# Patient Record
Sex: Female | Born: 1980 | Race: White | Hispanic: No | Marital: Married | Smoking: Never smoker
Health system: Southern US, Community
[De-identification: ages and names within clinical notes are randomized; demographics above are authoritative.]

## PROBLEM LIST (undated history)

## (undated) DIAGNOSIS — N83209 Unspecified ovarian cyst, unspecified side: Secondary | ICD-10-CM

## (undated) DIAGNOSIS — J45909 Unspecified asthma, uncomplicated: Secondary | ICD-10-CM

## (undated) HISTORY — DX: Unspecified ovarian cyst, unspecified side: N83.209

## (undated) HISTORY — DX: Unspecified asthma, uncomplicated: J45.909

## (undated) HISTORY — PX: CHOLECYSTECTOMY: SHX55

## (undated) HISTORY — PX: MOLE REMOVAL: SHX2046

## (undated) HISTORY — PX: KNEE SURGERY: SHX244

---

## 2007-01-18 ENCOUNTER — Ambulatory Visit: Payer: Self-pay | Admitting: Family Medicine

## 2007-02-23 ENCOUNTER — Ambulatory Visit: Payer: Self-pay | Admitting: Family Medicine

## 2007-02-23 DIAGNOSIS — S335XXA Sprain of ligaments of lumbar spine, initial encounter: Secondary | ICD-10-CM | POA: Insufficient documentation

## 2007-02-24 ENCOUNTER — Encounter: Payer: Self-pay | Admitting: Family Medicine

## 2007-04-15 ENCOUNTER — Ambulatory Visit: Payer: Self-pay | Admitting: Family Medicine

## 2007-04-15 DIAGNOSIS — A0839 Other viral enteritis: Secondary | ICD-10-CM | POA: Insufficient documentation

## 2007-11-28 ENCOUNTER — Ambulatory Visit: Payer: Self-pay | Admitting: Family Medicine

## 2007-12-26 ENCOUNTER — Other Ambulatory Visit: Admission: RE | Admit: 2007-12-26 | Discharge: 2007-12-26 | Payer: Self-pay | Admitting: Obstetrics and Gynecology

## 2008-03-15 ENCOUNTER — Ambulatory Visit: Payer: Self-pay | Admitting: Family Medicine

## 2008-03-15 DIAGNOSIS — J9801 Acute bronchospasm: Secondary | ICD-10-CM

## 2008-03-22 ENCOUNTER — Ambulatory Visit: Payer: Self-pay | Admitting: Family Medicine

## 2008-03-22 DIAGNOSIS — H6690 Otitis media, unspecified, unspecified ear: Secondary | ICD-10-CM | POA: Insufficient documentation

## 2008-04-05 ENCOUNTER — Ambulatory Visit: Payer: Self-pay | Admitting: Family Medicine

## 2008-09-05 ENCOUNTER — Ambulatory Visit: Payer: Self-pay | Admitting: Family Medicine

## 2008-09-05 DIAGNOSIS — J069 Acute upper respiratory infection, unspecified: Secondary | ICD-10-CM | POA: Insufficient documentation

## 2010-09-12 ENCOUNTER — Ambulatory Visit
Admission: RE | Admit: 2010-09-12 | Discharge: 2010-09-12 | Payer: Self-pay | Source: Home / Self Care | Attending: Family Medicine | Admitting: Family Medicine

## 2010-09-12 DIAGNOSIS — J209 Acute bronchitis, unspecified: Secondary | ICD-10-CM | POA: Insufficient documentation

## 2010-09-30 ENCOUNTER — Ambulatory Visit: Admit: 2010-09-30 | Payer: Self-pay | Admitting: Family Medicine

## 2010-10-09 NOTE — Assessment & Plan Note (Signed)
Summary: cough   Vital Signs:  Patient profile:   30 year old female Height:      63 inches Weight:      144 pounds BMI:     25.60 O2 Sat:      98 % on Room air Temp:     98.1 degrees F oral Pulse rate:   80 / minute BP sitting:   121 / 73  (left arm) Cuff size:   regular  Vitals Entered By: Payton Spark CMA (September 12, 2010 2:28 PM)  O2 Flow:  Room air CC: Bad cough x 3 days- getting worse.   Primary Care Provider:  Nani Gasser MD  CC:  Bad cough x 3 days- getting worse.Marland Kitchen  History of Present Illness: 30 yo WF presents for a worsening cough x 3 days to the point of post tussive emesis.  She has coughed up green phlegm.  She has slight nauseated.  She is congested now.  No fevers or chills.  Geting more HAs.  The cough is keeping her up at night. Nyquil and Robutissin helps some.  She has chest tightness and SOB.  She was told that she had 'chronic bronchitis' in the past but has not been on anything.  30 She has never been diagnosed with allergies and she is not a smoker.    She has mild sore throat but no rhinorrhea.  Current Medications (verified): 1)  None  Allergies (verified): No Known Drug Allergies  Past History:  Past Medical History: Reviewed history from 04/05/2008 and no changes required. Spirometry 04-05-08 FVC 93%, FEV1 82%, FEV1% 87% with no response to albuterl. Normal spiro  Social History: Reviewed history from 01/18/2007 and no changes required. 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 Single but has a roomate.   Never Smoked Alcohol use-yes Drug use-no Regular exercise-yes  Review of Systems      See HPI  Physical Exam  General:  alert, well-developed, well-nourished, and well-hydrated.   Head:  normocephalic and atraumatic.  sinuses NTTP Eyes:  conjunctiva clear Ears:  EACs patent; TMs translucent and gray with good cone of light and bony landmarks.  Nose:  nasal congestion present, no  rhinorrhea Mouth:  o/p slightly injected iwth clear postnasal drip Neck:  no masses.   Chest Wall:  no tenderness.   Lungs:  Normal respiratory effort, chest expands symmetrically. Lungs are clear to auscultation, no crackles or wheezes.  dry cough with scant rhonchi Heart:  Normal rate and regular rhythm. S1 and S2 normal without gallop, murmur, click, rub or other extra sounds. Skin:  color normal.   Cervical Nodes:  shotty anterior cervical chain LA   Impression & Recommendations:  Problem # 1:  ACUTE BRONCHITIS (ICD-466.0)  Chronic cough (probable allergies or allergies) with worsening of symptoms along with PTE. Will treat with Amoxicillin x 10 days, Delsym during the day and Nyquil at night.   Added ProAir 2 puffs 3-4 x a day for 10 days for SOB/ chest tightness.  PFs in yellow zone. Recheck PFs in 2 wks.  Had normal PFTs in 09. Consider repeat or allergy testing. The following medications were removed from the medication list:    Ventolin Hfa 108 (90 Base) Mcg/act Aers (Albuterol sulfate) .Marland Kitchen... 2 puffs q 6 hrs as needed Her updated medication list for this problem includes:    Amoxicillin 500 Mg Caps (Amoxicillin) .Marland Kitchen... 1 capsule by mouth three times a day x 10 days  Proair Hfa 108 (90 Base) Mcg/act Aers (Albuterol sulfate) .Marland Kitchen... 2 puffs 4 x a day as needed for dry cough and shortness of breathe  Orders: Peak Flow Rate (94150)  Complete Medication List: 1)  Amoxicillin 500 Mg Caps (Amoxicillin) .Marland Kitchen.. 1 capsule by mouth three times a day x 10 days 2)  Proair Hfa 108 (90 Base) Mcg/act Aers (Albuterol sulfate) .... 2 puffs 4 x a day as needed for dry cough and shortness of breathe  Patient Instructions: 1)  Treat with Amoxicillin 3 x a day x 10 days for bronchitits. 2)  Use ProAir inhaler 3-4 x a day for the next 7-10 days. 3)  Use Delsym during the day and Nyquil at night for cough. 4)  Return for f/u cough/ repeat peak flows in 2 wks. Prescriptions: AMOXICILLIN 500 MG CAPS  (AMOXICILLIN) 1 capsule by mouth three times a day x 10 days  #30 x 0   Entered and Authorized by:   Seymour Bars DO   Signed by:   Seymour Bars DO on 09/12/2010   Method used:   Electronically to        [facility] [phone]* (retail)       [address] [place], [place]  [address]       Ph: [phone] or [phone]       Fax: [phone]   RxID:   [id number]    Orders Added: 1)  Est. Patient Level III [[address]644] 2)  Peak Flow Rate [94150]

## 2011-08-04 ENCOUNTER — Telehealth: Payer: Self-pay | Admitting: Family Medicine

## 2011-08-04 MED ORDER — AZITHROMYCIN 250 MG PO TABS: ORAL_TABLET | ORAL | Status: AC

## 2011-08-04 NOTE — Telephone Encounter (Signed)
Will send rx to [facility]

## 2011-08-04 NOTE — Telephone Encounter (Signed)
Call pt: exposed to pertusis at work. Health dept rec prophylactic tx. Will send over zpack for tx. I entered med, please get her pharmacy to fax to.

## 2012-06-02 ENCOUNTER — Encounter: Payer: Self-pay | Admitting: Family Medicine

## 2012-06-02 ENCOUNTER — Ambulatory Visit (INDEPENDENT_AMBULATORY_CARE_PROVIDER_SITE_OTHER): Payer: Self-pay | Admitting: Family Medicine

## 2012-06-02 VITALS — BP 111/76 | HR 90 | Wt 156.0 lb

## 2012-06-02 DIAGNOSIS — B354 Tinea corporis: Secondary | ICD-10-CM

## 2012-06-02 MED ORDER — PREDNISONE 20 MG PO TABS: ORAL_TABLET | ORAL | Status: AC

## 2012-06-02 MED ORDER — CLOTRIMAZOLE 1 % EX CREA
TOPICAL_CREAM | CUTANEOUS | Status: AC
Start: 2012-06-02 — End: 2012-07-02

## 2012-06-02 NOTE — Progress Notes (Signed)
CC: Stephanie Hogan is a 31 y.o. female is here for Rash and mole on back   Subjective: HPI:  Complains of a mole on the right side of her back and present for years has not been changing in size or appearance more she can tell but it is hard to view given its location. She asks if symptoms of her worry about. Has not caused any psychological or physical discomfort nor bleeding. She's no personal history of skin cancers but she does spend time in the sun without sun block.  Her primary complaint is a rash that's been present for matter of days and getting worse in a daily basis located on the right thigh right lateral midback and left lateral midback in addition to a new regimen this morning on the left medial collarbone region. Is described as a itchy and stinging rash she's never had before. She's not been exposed to anybody with similar rashes. 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 She denies rapid heartbeat, flushing, GI disturbance, sore in the mouth or tongue, wheezing, nor shortness of breath. No interventions as of yet   Review Of Systems Outlined In HPI  No past medical history on file.   No family history on file.   History  Substance Use Topics  . Smoking status: Never Smoker   . Smokeless tobacco: Not on file  . Alcohol Use: No     Objective: Filed Vitals:   06/02/12 0940  BP: 111/76  Pulse: 90    General: Alert and Oriented, No Acute Distress HEENT: Pupils equal, round, reactive to light. Conjunctivae clear.    Moist mucous membranes, pharynx without inflammation nor lesions.  Neck supple without palpable lymphadenopathy nor abnormal masses. Lungs: Clear to auscultation bilaterally, no wheezing/ronchi/rales.  Comfortable work of breathing. Good air movement. Cardiac: Regular rate and rhythm.  Skin: Warm and dry. Near the inferior portion of the right shoulder blade there  is a raised pigmented lesion that is symmetric, without border irregular these, homogenous color, a diameter less than eraser head. There is a separate skin eruption that appears scaly, with a macular papular appearance with mild erythema and with central clearing is involving the left axilla, right lateral thigh, left inferior angle of the scapula.  Skin scraping for KOH prep was prepared from the above scaling lesions revealing hyphae.  Assessment & Plan: [name] was seen today for rash and mole on back.  Diagnoses and associated orders for this visit:  Tinea corporis - predniSONE (DELTASONE) 20 MG tablet; Three tabs at once daily for five days. - clotrimazole (LOTRIMIN) 1 % cream; Apply to affected areas twice a day for up to four weeks, applying up to two weeks after resolution of symptoms.    Discussed with the patient that her pigmented lesion did not meet criteria for to be suspicious of a melanoma however I gave her the option of excision to get a definitive diagnosis she declined. She states the itching is "unbearable" therefore we'll start prednisone she has taken this before and understands the expected side effects, described her that this may make the rash worse she hasn't used definitive treatment being clotrimazole twice a day basis. Asked her to return next week if not improving. Discussed the need to cover this while in the daycare setting or wall exposed to others to prevent transmission.  Return if symptoms worsen or fail to improve.

## 2014-01-17 DIAGNOSIS — R748 Abnormal levels of other serum enzymes: Secondary | ICD-10-CM | POA: Insufficient documentation

## 2014-01-30 ENCOUNTER — Ambulatory Visit (INDEPENDENT_AMBULATORY_CARE_PROVIDER_SITE_OTHER): Payer: Self-pay | Admitting: Obstetrics & Gynecology

## 2014-01-30 ENCOUNTER — Encounter: Payer: Self-pay | Admitting: Obstetrics & Gynecology

## 2014-01-30 VITALS — BP 114/80 | HR 100 | Resp 16 | Ht 62.0 in | Wt 143.0 lb

## 2014-01-30 DIAGNOSIS — N83209 Unspecified ovarian cyst, unspecified side: Secondary | ICD-10-CM

## 2014-01-30 MED ORDER — NORETHIN-ETH ESTRAD-FE BIPHAS 1 MG-10 MCG / 10 MCG PO THPK: 1.0000 | PACK | Freq: Every morning | ORAL | Status: DC

## 2014-01-30 NOTE — Progress Notes (Signed)
   Subjective:    Patient ID: Stephanie Hogan, female    DOB: March 24, 1981, 33 y.o.   MRN: [id number]  HPI  33 yo SW G0 here today to discuss an ovarian cyst that was discovered with a CT in [place] that was done for her gallstones about 3 weeks ago. She had her GB removed 1 week ago.   She was last sexually active about 3-4 weeks ago (held off due to the GB pain). She uses condoms for contraception. She does report heavy periods with clots that last 4-6 days. Sometimes her periods are painful. She has never used OCPs.  Review of Systems     Objective:   Physical Exam        Assessment & Plan:  Ovarian cyst- I discussed normal ovulation and cysts. She now feels much better. She will start OCPs today to help with her menstrual cyst.

## 2014-04-18 ENCOUNTER — Encounter (HOSPITAL_COMMUNITY): Payer: Self-pay

## 2014-05-01 ENCOUNTER — Telehealth: Payer: Self-pay | Admitting: *Deleted

## 2014-05-03 NOTE — Telephone Encounter (Signed)
Error

## 2014-05-15 ENCOUNTER — Encounter: Payer: Self-pay | Admitting: Family Medicine

## 2014-05-15 ENCOUNTER — Ambulatory Visit (INDEPENDENT_AMBULATORY_CARE_PROVIDER_SITE_OTHER): Payer: Self-pay | Admitting: Family Medicine

## 2014-05-15 VITALS — BP 119/79 | HR 82 | Ht 62.0 in | Wt 144.0 lb

## 2014-05-15 DIAGNOSIS — Z0000 Encounter for general adult medical examination without abnormal findings: Secondary | ICD-10-CM

## 2014-05-15 DIAGNOSIS — Z23 Encounter for immunization: Secondary | ICD-10-CM

## 2014-05-15 NOTE — Patient Instructions (Addendum)
Psyllium Powder 1-2 heaping teaspoons daily to help regulate bowel movements.   Dr. Lajoyce Lauber General Advice Following Your Complete Physical Exam  The Benefits of Regular Exercise: Unless you suffer from an uncontrolled cardiovascular condition, studies strongly suggest that regular exercise and physical activity will add to both the quality and length of your life.  The World Health Organization recommends 150 minutes of moderate intensity aerobic activity every week.  This is best split over 3-4 days a week, and can be as simple as a brisk walk for just over 35 minutes "most days of the week".  This type of exercise has been shown to lower LDL-Cholesterol, lower average blood sugars, lower blood pressure, lower cardiovascular disease risk, improve memory, and increase one's overall sense of wellbeing.  The addition of anaerobic (or "strength training") exercises offers additional benefits including but not limited to increased metabolism, prevention of osteoporosis, and improved overall cholesterol levels.  How Can I Strive For A Low-Fat Diet?: Current guidelines recommend that 25-35 percent of your daily energy (food) intake should come from fats.  One might ask how can this be achieved without having to dissect each meal on a daily basis?  Switch to skim or 1% milk instead of whole milk.  Focus on lean meats such as ground [place], fresh fish, baked chicken, and lean cuts of beef as your source of dietary protein.  Limit saturated fat consumption to less than 10% of your daily caloric intake.  Limit trans fatty acid consumption primarily by limiting synthetic trans fats such as partially hydrogenated oils (Ex: fried fast foods).  Substitute olive or vegetable oil for solid fats where possible.  Moderation of Salt Intake: Provided you don't carry a diagnosis of congestive heart failure nor renal failure, I recommend a daily allowance of no more than 2300 mg of salt (sodium).  Keeping under this  daily goal is associated with a decreased risk of cardiovascular events, creeping above it can lead to elevated blood pressures and increases your risk of cardiovascular events.  Milligrams (mg) of salt is listed on all nutrition labels, and your daily intake can add up faster than you think.  Most canned and frozen dinners can pack in over half your daily salt allowance in one meal.    Lifestyle Health Risks: Certain lifestyle choices carry specific health risks.  As you may already know, tobacco use has been associated with increasing one's risk of cardiovascular disease, pulmonary disease, numerous cancers, among many other issues.  What you may not know is that there are medications and nicotine replacement strategies that can more than double your chances of successfully quitting.  I would be thrilled to help manage your quitting strategy if you currently use tobacco products.  When it comes to alcohol use, I've yet to find an "ideal" daily allowance.  Provided an individual does not have a medical condition that is exacerbated by alcohol consumption, general guidelines determine "safe drinking" as no more than two standard drinks for a man or no more than one standard drink for a female per day.  However, much debate still exists on whether any amount of alcohol consumption is technically "safe".  My general advice, keep alcohol consumption to a minimum for general health promotion.  If you or others believe that alcohol, tobacco, or recreational drug use is interfering with your life, I would be happy to provide confidential counseling regarding treatment options.  General "Over The Counter" Nutrition Advice: Postmenopausal women should aim for a daily calcium  intake of 1200 mg, however a significant portion of this might already be provided by diets including milk, yogurt, cheese, and other dairy products.  Vitamin D has been shown to help preserve bone density, prevent fatigue, and has even been shown  to help reduce falls in the elderly.  Ensuring a daily intake of 800 Units of Vitamin D is a good place to start to enjoy the above benefits, we can easily check your Vitamin D level to see if you'd potentially benefit from supplementation beyond 800 Units a day.  Folic Acid intake should be of particular concern to women of childbearing age.  Daily consumption of 505-397 mcg of Folic Acid is recommended to minimize the chance of spinal cord defects in a fetus should pregnancy occur.    For many adults, accidents still remain one of the most common culprits when it comes to cause of death.  Some of the simplest but most effective preventitive habits you can adopt include regular seatbelt use, proper helmet use, securing firearms, and regularly testing your smoke and carbon monoxide detectors.  Stephanie Hogan Stephanie Hogan [address] [place], [place], [place] [address] Phone: [phone]

## 2014-05-15 NOTE — Progress Notes (Signed)
CC: Stephanie Hogan is a 33 y.o. female is here for Annual Exam   Subjective: HPI:  Colonoscopy: No family history of colon cancer, will begin screening at age 63 Papsmear: Normal in June, repeat in 3-5 years Mammogram: No family history of breast cancer we'll consider screening at age 37  Influenza Vaccine: Will receive today Pneumovax: No current indication Td/Tdap: overdue, will receive today Zoster: (Start 34 yo)  Rare alcohol use no tobacco or recreational drug use   Review of Systems - General ROS: negative for - chills, fever, night sweats, weight gain or weight loss Ophthalmic ROS: negative for - decreased vision Psychological ROS: negative for - anxiety or depression ENT ROS: negative for - hearing change, nasal congestion, tinnitus or allergies Hematological and Lymphatic ROS: negative for - bleeding problems, bruising or swollen lymph nodes Breast ROS: negative Respiratory ROS: no cough, shortness of breath, or wheezing Cardiovascular ROS: no chest pain or dyspnea on exertion Gastrointestinal ROS: no abdominal pain, change in bowel habits, or black or bloody stools Genito-Urinary ROS: negative for - genital discharge, genital ulcers, incontinence or abnormal bleeding from genitals Musculoskeletal ROS: negative for - joint pain or muscle pain Neurological ROS: negative for - headaches or memory loss Dermatological ROS: negative for lumps, mole changes, rash and skin lesion changes  Past Medical History  Diagnosis Date  . Ovarian cyst   . Asthma     Past Surgical History  Procedure Laterality Date  . Cholecystectomy    . Knee surgery    . Mole removal     Family History  Problem Relation Age of Onset  . Lung cancer Maternal Grandfather   . Breast cancer Maternal Grandmother   . Alzheimer's disease Paternal Grandfather     History   Social History  . Marital Status: Single    Spouse Name: N/A    Number of Children: N/A  . Years of Education: N/A    Occupational History  . Not on file.   Social History Main Topics  . Smoking status: Never Smoker   . Smokeless tobacco: Never Used  . Alcohol Use: 1.0 oz/week    2 drink(s) per week     Comment: Occasional  . Drug Use: No  . Sexual Activity: Yes   Other Topics Concern  . Not on file   Social History Narrative  . No narrative on file     Objective: BP 119/79  Pulse 82  Ht 5\' 2"  (1.575 m)  Wt 144 lb (65.318 kg)  BMI 26.33 kg/m2  General: No Acute Distress HEENT: Atraumatic, normocephalic, conjunctivae normal without scleral icterus.  No nasal discharge, hearing grossly intact, TMs with good landmarks bilaterally with no middle ear abnormalities, posterior pharynx clear without oral lesions. Neck: Supple, trachea midline, no cervical nor supraclavicular adenopathy. Pulmonary: Clear to auscultation bilaterally without wheezing, rhonchi, nor rales. Cardiac: Regular rate and rhythm.  No murmurs, rubs, nor gallops. No peripheral edema.  2+ peripheral pulses bilaterally. Abdomen: Bowel sounds normal.  No masses.  Non-tender without rebound.  Negative Murphy's sign. MSK: Grossly intact, no signs of weakness.  Full strength throughout upper and lower extremities.  Full ROM in upper and lower extremities.  No midline spinal tenderness. Neuro: Gait unremarkable, CN II-XII grossly intact.  C5-C6 Reflex 2/4 Bilaterally, L4 Reflex 2/4 Bilaterally.  Cerebellar function intact. Skin: No rashes. Psych: Alert and oriented to person/place/time.  Thought process normal. No anxiety/depression.  Assessment & Plan: Stephanie Hogan was seen today for annual exam.  Diagnoses and  associated orders for this visit:  Annual physical exam - PPD - Tdap vaccine greater than or equal to 7yo IM - Flu Vaccine QUAD 36+ mos IM    Healthy lifestyle interventions including but not limited to regular exercise, a healthy low fat diet, moderation of salt intake, the dangers of tobacco/alcohol/recreational drug use,  nutrition supplementation, and accident avoidance were discussed with the patient and a handout was provided for future reference.  Within the last month she had blood sugar, kidney function and liver function checked through the [facility] system and all were normal. Cholesterol last year was perfect therefore we will not check again until next year.  She required a PPD for her new job   Return if symptoms worsen or fail to improve.

## 2014-05-17 LAB — TB SKIN TEST: TB SKIN TEST: NEGATIVE

## 2014-09-27 ENCOUNTER — Other Ambulatory Visit: Payer: Self-pay | Admitting: *Deleted

## 2014-09-27 DIAGNOSIS — Z3041 Encounter for surveillance of contraceptive pills: Secondary | ICD-10-CM

## 2014-09-27 MED ORDER — NORETHIN-ETH ESTRAD-FE BIPHAS 1 MG-10 MCG / 10 MCG PO THPK: 1.0000 | PACK | Freq: Every morning | ORAL | Status: DC

## 2014-09-27 NOTE — Telephone Encounter (Signed)
RF on OCP's sent to [facility] until May 2016

## 2014-10-01 ENCOUNTER — Telehealth: Payer: Self-pay | Admitting: *Deleted

## 2014-10-01 DIAGNOSIS — Z30011 Encounter for initial prescription of contraceptive pills: Secondary | ICD-10-CM

## 2014-10-01 MED ORDER — NORGESTREL-ETHINYL ESTRADIOL 0.3-30 MG-MCG PO TABS: 1.0000 | ORAL_TABLET | Freq: Every day | ORAL | Status: DC

## 2014-10-01 NOTE — Telephone Encounter (Signed)
Pt called stating that her insurance was not covering the [place] FE,  Spoke with Dr Hulan Fray who changed the RX to [place].  This was sent to pharmacy

## 2016-02-19 ENCOUNTER — Encounter: Payer: Self-pay | Admitting: Emergency Medicine

## 2016-02-19 ENCOUNTER — Emergency Department
Admission: EM | Admit: 2016-02-19 | Discharge: 2016-02-19 | Disposition: A | Discharge status: Home / Self Care | Payer: Self-pay | Source: Home / Self Care

## 2016-02-19 DIAGNOSIS — R21 Rash and other nonspecific skin eruption: Secondary | ICD-10-CM

## 2016-02-19 MED ORDER — METHYLPREDNISOLONE SODIUM SUCC 125 MG IJ SOLR
125.0000 mg | Freq: Once | INTRAMUSCULAR | Status: AC
  Administered 2016-02-19: 125 mg via INTRAMUSCULAR

## 2016-02-19 MED ORDER — PREDNISONE 10 MG PO TABS: ORAL_TABLET | ORAL | Status: DC

## 2016-02-19 NOTE — Discharge Instructions (Signed)
Contact Dermatitis Dermatitis is redness, soreness, and swelling (inflammation) of the skin. Contact dermatitis is a reaction to certain substances that touch the skin. There are two types of contact dermatitis:   Irritant contact dermatitis. This type is caused by something that irritates your skin, such as dry hands from washing them too much. This type does not require previous exposure to the substance for a reaction to occur. This type is more common.  Allergic contact dermatitis. This type is caused by a substance that you are allergic to, such as a nickel allergy or poison ivy. This type only occurs if you have been exposed to the substance (allergen) before. Upon a repeat exposure, your body reacts to the substance. This type is less common. CAUSES  Many different substances can cause contact dermatitis. Irritant contact dermatitis is most commonly caused by exposure to:   Makeup.   Soaps.   Detergents.   Bleaches.   Acids.   Metal salts, such as nickel.  Allergic contact dermatitis is most commonly caused by exposure to:   Poisonous plants.   Chemicals.   Jewelry.   Latex.   Medicines.   Preservatives in products, such as clothing.  RISK FACTORS This condition is more likely to develop in:   People who have jobs that expose them to irritants or allergens.  People who have certain medical conditions, such as asthma or eczema.  SYMPTOMS  Symptoms of this condition may occur anywhere on your body where the irritant has touched you or is touched by you. Symptoms include:  Dryness or flaking.   Redness.   Cracks.   Itching.   Pain or a burning feeling.   Blisters.  Drainage of small amounts of blood or clear fluid from skin cracks. With allergic contact dermatitis, there may also be swelling in areas such as the eyelids, mouth, or genitals.  DIAGNOSIS  This condition is diagnosed with a medical history and physical exam. A patch skin test  may be performed to help determine the cause. If the condition is related to your job, you may need to see an occupational medicine specialist. TREATMENT Treatment for this condition includes figuring out what caused the reaction and protecting your skin from further contact. Treatment may also include:   Steroid creams or ointments. Oral steroid medicines may be needed in more severe cases.  Antibiotics or antibacterial ointments, if a skin infection is present.  Antihistamine lotion or an antihistamine taken by mouth to ease itching.  A bandage (dressing). HOME CARE INSTRUCTIONS Skin Care  Moisturize your skin as needed.   Apply cool compresses to the affected areas.  Try taking a bath with:  Epsom salts. Follow the instructions on the packaging. You can get these at your local pharmacy or grocery store.  Baking soda. Pour a small amount into the bath as directed by your health care provider.  Colloidal oatmeal. Follow the instructions on the packaging. You can get this at your local pharmacy or grocery store.  Try applying baking soda paste to your skin. Stir water into baking soda until it reaches a paste-like consistency.  Do not scratch your skin.  Bathe less frequently, such as every other day.  Bathe in lukewarm water. Avoid using hot water. Medicines  Take or apply over-the-counter and prescription medicines only as told by your health care provider.   If you were prescribed an antibiotic medicine, take or apply your antibiotic as told by your health care provider. Do not stop using the   antibiotic even if your condition starts to improve. General Instructions  Keep all follow-up visits as told by your health care provider. This is important.  Avoid the substance that caused your reaction. If you do not know what caused it, keep a journal to try to track what caused it. Write down:  What you eat.  What cosmetic products you use.  What you drink.  What  you wear in the affected area. This includes jewelry.  If you were given a dressing, take care of it as told by your health care provider. This includes when to change and remove it. SEEK MEDICAL CARE IF:   Your condition does not improve with treatment.  Your condition gets worse.  You have signs of infection such as swelling, tenderness, redness, soreness, or warmth in the affected area.  You have a fever.  You have new symptoms. SEEK IMMEDIATE MEDICAL CARE IF:   You have a severe headache, neck pain, or neck stiffness.  You vomit.  You feel very sleepy.  You notice red streaks coming from the affected area.  Your bone or joint underneath the affected area becomes painful after the skin has healed.  The affected area turns darker.  You have difficulty breathing.   This information is not intended to replace advice given to you by your health care provider. Make sure you discuss any questions you have with your health care provider.   Document Released: 08/21/2000 Document Revised: 05/15/2015 Document Reviewed: 01/09/2015 Elsevier Interactive Patient Education 2016 [employer]  

## 2016-02-19 NOTE — ED Notes (Signed)
Pt c/o rash on buttocks,ches,t and legs. Started x2 weeks ago and is spreading. Itching and burning.

## 2016-02-19 NOTE — ED Provider Notes (Signed)
CSN: [id number]     Arrival date & time 02/19/16  1615 History   None    Chief Complaint  Patient presents with  . Rash   (Consider location/radiation/quality/duration/timing/severity/associated sxs/prior Treatment) Patient is a 35 y.o. female presenting with rash. The history is provided by the patient. No language interpreter was used.  Rash Location:  Torso Torso rash location:  R chest and L chest Quality: itchiness   Severity:  Moderate Onset quality:  Gradual Duration:  2 weeks Timing:  Constant Progression:  Worsening Chronicity:  New Context: exposure to similar rash   Relieved by:  Nothing Worsened by:  Nothing tried Ineffective treatments:  None tried Associated symptoms: no nausea   Pt complains of a rash on her chest, arm, buttock and neck.  No new exposures  Past Medical History  Diagnosis Date  . Ovarian cyst   . Asthma    Past Surgical History  Procedure Laterality Date  . Cholecystectomy    . Knee surgery    . Mole removal     Family History  Problem Relation Age of Onset  . Lung cancer Maternal Grandfather   . Breast cancer Maternal Grandmother   . Alzheimer's disease Paternal Grandfather    Social History  Substance Use Topics  . Smoking status: Never Smoker   . Smokeless tobacco: Never Used  . Alcohol Use: 1.0 oz/week    2 drink(s) per week     Comment: Occasional   OB History    Gravida Para Term Preterm AB TAB SAB Ectopic Multiple Living   0 0 0 0 0 0 0 0 0 0      Review of Systems  Gastrointestinal: Negative for nausea.  Skin: Positive for rash.  All other systems reviewed and are negative.   Allergies  Review of patient's allergies indicates no known allergies.  Home Medications   Prior to Admission medications   Medication Sig Start Date End Date Taking? Authorizing Provider  norgestrel-ethinyl estradiol (LO/OVRAL,CRYSELLE) 0.3-30 MG-MCG tablet Take 1 tablet by mouth daily. 10/01/14   Emily Filbert, MD  predniSONE (DELTASONE)  10 MG tablet 5,4,3,2,1 taper 02/19/16   Fransico Meadow, PA-C   Meds Ordered and Administered this Visit   Medications  methylPREDNISolone sodium succinate (SOLU-MEDROL) 125 mg/2 mL injection 125 mg (not administered)    BP 120/87 mmHg  Pulse 100  Temp(Src) 98.5 F (36.9 C) (Oral)  Wt 155 lb (70.308 kg)  SpO2 100%  LMP 02/11/2016 No data found.   Physical Exam  Constitutional: She is oriented to person, place, and time. She appears well-developed and well-nourished.  HENT:  Head: Normocephalic.  Eyes: EOM are normal.  Neck: Normal range of motion.  Pulmonary/Chest: Effort normal.  Abdominal: She exhibits no distension.  Musculoskeletal: Normal range of motion.  Neurological: She is alert and oriented to person, place, and time.  Skin: Rash noted.  Red raised rash chest, neck buttocks.  Looks like contact  Psychiatric: She has a normal mood and affect.  Nursing note and vitals reviewed.   ED Course  Procedures (including critical care time)  Labs Review Labs Reviewed - No data to display  Imaging Review No results found.   Visual Acuity Review  Right Eye Distance:   Left Eye Distance:   Bilateral Distance:    Right Eye Near:   Left Eye Near:    Bilateral Near:         MDM   1. Rash and nonspecific skin eruption  Meds ordered this encounter  Medications  . methylPREDNISolone sodium succinate (SOLU-MEDROL) 125 mg/2 mL injection 125 mg    Sig:   . predniSONE (DELTASONE) 10 MG tablet    Sig: 5,4,3,2,1 taper    Dispense:  15 tablet    Refill:  0    Order Specific Question:  Supervising Provider    AnswerBurnett Harry, DAVID [[id number]]  An After Visit Summary was printed and given to the patient.    [place], PA-C 02/19/16 1701

## 2016-03-02 ENCOUNTER — Telehealth: Payer: Self-pay | Admitting: *Deleted

## 2016-03-02 MED ORDER — TRIAMCINOLONE ACETONIDE 0.1 % EX CREA: 1.0000 "application " | TOPICAL_CREAM | Freq: Two times a day (BID) | CUTANEOUS | Status: DC

## 2016-03-02 NOTE — ED Notes (Signed)
Pt was seen by Alyse Low for rash. She reports the rash on her chest is the only site that has not really improved or resolved, everywhere else resolved completely. I notified patient that Erin sent in triamcinolone cream to her [employer]. Limit sun exposure while using cream.

## 2016-04-02 ENCOUNTER — Ambulatory Visit (INDEPENDENT_AMBULATORY_CARE_PROVIDER_SITE_OTHER): Payer: Self-pay | Admitting: Family Medicine

## 2016-04-02 ENCOUNTER — Ambulatory Visit (INDEPENDENT_AMBULATORY_CARE_PROVIDER_SITE_OTHER): Payer: Self-pay

## 2016-04-02 ENCOUNTER — Encounter: Payer: Self-pay | Admitting: Family Medicine

## 2016-04-02 VITALS — BP 118/88 | HR 89 | Wt 157.0 lb

## 2016-04-02 DIAGNOSIS — R059 Cough, unspecified: Secondary | ICD-10-CM

## 2016-04-02 DIAGNOSIS — L309 Dermatitis, unspecified: Secondary | ICD-10-CM

## 2016-04-02 DIAGNOSIS — R05 Cough: Secondary | ICD-10-CM

## 2016-04-02 MED ORDER — METHYLPREDNISOLONE ACETATE 80 MG/ML IJ SUSP
80.0000 mg | Freq: Once | INTRAMUSCULAR | Status: AC
  Administered 2016-04-02: 80 mg via INTRAMUSCULAR

## 2016-04-02 MED ORDER — ALBUTEROL SULFATE 108 (90 BASE) MCG/ACT IN AEPB: 2.0000 | INHALATION_SPRAY | RESPIRATORY_TRACT | 0 refills | Status: DC | PRN

## 2016-04-02 MED ORDER — PREDNISOLONE SODIUM PHOSPHATE 15 MG/5ML PO SOLN: 60.0000 mg | Freq: Every day | ORAL | 0 refills | Status: DC

## 2016-04-02 NOTE — Addendum Note (Signed)
Addended by: Delrae Alfred on: 04/02/2016 10:55 AM   Modules accepted: Orders

## 2016-04-02 NOTE — Progress Notes (Signed)
CC: Stephanie Hogan is a 35 y.o. female is here for Cough and Rash   Subjective: HPI:  Cough and wheezing on a daily basis for the past week. It's worse in dusty or hot environments. It's improved with albuterol however only temporarily. She denies any shortness of breath unless she is actively coughing. She denies fever, chills or facial pressure or nasal congestion.  She has a rash on her buttocks has been present for the past 2 weeks. It's not responding to nystatin cream. She had this last month that went away temporarily after getting a steroid injection. She tells me it somewhat itchy and somewhat stinging when wet. She denies any skin changes elsewhere.   Review Of Systems Outlined In HPI  Past Medical History:  Diagnosis Date  . Asthma   . Ovarian cyst     Past Surgical History:  Procedure Laterality Date  . CHOLECYSTECTOMY    . KNEE SURGERY    . MOLE REMOVAL     Family History  Problem Relation Age of Onset  . Lung cancer Maternal Grandfather   . Breast cancer Maternal Grandmother   . Alzheimer's disease Paternal Grandfather     Social History   Social History  . Marital status: Single    Spouse name: N/A  . Number of children: N/A  . Years of education: N/A   Occupational History  . Not on file.   Social History Main Topics  . Smoking status: Never Smoker  . Smokeless tobacco: Never Used  . Alcohol use 1.0 oz/week    2 drink(s) per week     Comment: Occasional  . Drug use: No  . Sexual activity: Yes   Other Topics Concern  . Not on file   Social History Narrative  . No narrative on file     Objective: BP 118/88   Pulse 89   Wt 157 lb (71.2 kg)   BMI 28.72 kg/m   General: Alert and Oriented, No Acute Distress HEENT: Pupils equal, round, reactive to light. Conjunctivae clear.  External ears unremarkable, canals clear with intact TMs with appropriate landmarks.  Middle ear appears open without effusion. Pink inferior turbinates.  Moist mucous  membranes, pharynx without inflammation nor lesions.  Neck supple without palpable lymphadenopathy nor abnormal masses. Lungs: Clear to auscultation bilaterally, no wheezing/ronchi/rales.  Comfortable work of breathing. Good air movement. Extremities: No peripheral edema.  Strong peripheral pulses.  Mental Status: No depression, anxiety, nor agitation. Skin: Warm and dry. Chaperone present, Stephanie Hogan, dry eczematous changes on the cheeks of both buttocks, sparing the gluteal cleavage.  Assessment & Plan: Stephanie Hogan was seen today for cough and rash.  Diagnoses and all orders for this visit:  Cough -     DG Chest 2 View; Future  Dermatitis -     prednisoLONE (ORAPRED) 15 MG/5ML solution; Take 20 mLs (60 mg total) by mouth daily before breakfast.  Other orders -     Albuterol Sulfate (PROAIR RESPICLICK) 123XX123 (90 Base) MCG/ACT AEPB; Inhale 2 puffs into the lungs every 4 (four) hours as needed (asthma).   Cough: She is requesting a chest x-ray she tells me she's never had one before in her life. Seems reasonable, she has a history of asthma and I think that Depo-Medrol today and 5 days of prednisone will help with this. I also gave her a sample of one of our albuterol inhalers given that she is self-pay. Dermatitis: Seems to be eczema, low suspicion of candidal infection since nystatin didn't  help. Prednisolone that's primarily given for her asthma should help with this as well. She declined triamcinolone cream  Will update patient with plan following x-ray results.  Return if symptoms worsen or fail to improve.

## 2016-07-02 ENCOUNTER — Encounter: Payer: Self-pay | Admitting: Emergency Medicine

## 2016-07-02 ENCOUNTER — Emergency Department (INDEPENDENT_AMBULATORY_CARE_PROVIDER_SITE_OTHER)
Admission: EM | Admit: 2016-07-02 | Discharge: 2016-07-02 | Disposition: A | Discharge status: Home / Self Care | Payer: BC Managed Care – PPO | Source: Home / Self Care | Attending: Family Medicine | Admitting: Family Medicine

## 2016-07-02 DIAGNOSIS — M5442 Lumbago with sciatica, left side: Secondary | ICD-10-CM

## 2016-07-02 MED ORDER — PREDNISONE 20 MG PO TABS: ORAL_TABLET | ORAL | 0 refills | Status: DC

## 2016-07-02 MED ORDER — IBUPROFEN 600 MG PO TABS: 600.0000 mg | ORAL_TABLET | Freq: Four times a day (QID) | ORAL | 0 refills | Status: DC | PRN

## 2016-07-02 MED ORDER — METHOCARBAMOL 500 MG PO TABS: 500.0000 mg | ORAL_TABLET | Freq: Two times a day (BID) | ORAL | 0 refills | Status: DC

## 2016-07-02 MED ORDER — METHYLPREDNISOLONE SODIUM SUCC 40 MG IJ SOLR
80.0000 mg | Freq: Once | INTRAMUSCULAR | Status: AC
  Administered 2016-07-02: 80 mg via INTRAMUSCULAR

## 2016-07-02 MED ORDER — KETOROLAC TROMETHAMINE 60 MG/2ML IM SOLN
60.0000 mg | Freq: Once | INTRAMUSCULAR | Status: AC
  Administered 2016-07-02: 60 mg via INTRAMUSCULAR

## 2016-07-02 MED ORDER — HYDROCODONE-ACETAMINOPHEN 5-325 MG PO TABS: 1.0000 | ORAL_TABLET | Freq: Four times a day (QID) | ORAL | 0 refills | Status: DC | PRN

## 2016-07-02 NOTE — ED Triage Notes (Signed)
Patient leaned over and twisted while sitting in chair 7 days ago and felt abnormal twinge; pain in left hip, upper leg to knee has not improved and is limiting her movements.

## 2016-07-02 NOTE — ED Provider Notes (Signed)
CSN: [id number]     Arrival date & time 07/02/16  1823 History   First MD Initiated Contact with Patient 07/02/16 1854     Chief Complaint  Patient presents with  . Hip Pain    left  . Leg Pain  . Knee Pain   (Consider location/radiation/quality/duration/timing/severity/associated sxs/prior Treatment) HPI  Stephanie Hogan is a 35 y.o. female presenting to UC with c/o continued Left lower back pain that radiates down the anterior aspect of her Left thigh. Symptoms started about 7 days ago when she leaned over and twisted while sitting in a chair.  She felt an abnormal twinge at that time. Since then, pain is constant, aching, throbbing, and occasionally sharp and burning.  Pain is 0000000, worse with certain movements and positions.  She reports going to a [facility] after two days of pain but states they only gave her a few Flexeril, which did improve the pain but the medication did not last long.  Pt questioning if she needs any imaging or bigger workup.  Denies hx of similar pain in the past.  Denies change in bowel or bladder habits. Denies hx of back surgeries.    Past Medical History:  Diagnosis Date  . Asthma   . Ovarian cyst    Past Surgical History:  Procedure Laterality Date  . CHOLECYSTECTOMY    . KNEE SURGERY    . MOLE REMOVAL     Family History  Problem Relation Age of Onset  . Lung cancer Maternal Grandfather   . Breast cancer Maternal Grandmother   . Alzheimer's disease Paternal Grandfather    Social History  Substance Use Topics  . Smoking status: Never Smoker  . Smokeless tobacco: Never Used  . Alcohol use 1.0 oz/week    2 drink(s) per week     Comment: Occasional   OB History    Gravida Para Term Preterm AB Living   1 0 0 0 0 0   SAB TAB Ectopic Multiple Live Births   0 0 0 0       Review of Systems  Musculoskeletal: Positive for arthralgias, gait problem and myalgias. Negative for joint swelling, neck pain and neck stiffness.       Left lower back and Left  thigh pain  Skin: Negative for color change and wound.  Neurological: Positive for weakness (Left leg due to pain). Negative for numbness.    Allergies  Review of patient's allergies indicates no known allergies.  Home Medications   Prior to Admission medications   Medication Sig Start Date End Date Taking? Authorizing Provider  Albuterol Sulfate (PROAIR RESPICLICK) 123XX123 (90 Base) MCG/ACT AEPB Inhale 2 puffs into the lungs every 4 (four) hours as needed (asthma). 04/02/16   Marcial Pacas, DO  HYDROcodone-acetaminophen (NORCO/VICODIN) 5-325 MG tablet Take 1-2 tablets by mouth every 6 (six) hours as needed for moderate pain or severe pain. 07/02/16   Noland Fordyce, PA-C  ibuprofen (ADVIL,MOTRIN) 600 MG tablet Take 1 tablet (600 mg total) by mouth every 6 (six) hours as needed. 07/02/16   Noland Fordyce, PA-C  methocarbamol (ROBAXIN) 500 MG tablet Take 1 tablet (500 mg total) by mouth 2 (two) times daily. 07/02/16   Noland Fordyce, PA-C  prednisoLONE (ORAPRED) 15 MG/5ML solution Take 20 mLs (60 mg total) by mouth daily before breakfast. 04/02/16   Marcial Pacas, DO  predniSONE (DELTASONE) 20 MG tablet 3 tabs po daily x 3 days, then 2 tabs x 3 days, then 1.5 tabs x 3 days,  then 1 tab x 3 days, then 0.5 tabs x 3 days 07/02/16   Noland Fordyce, PA-C   Meds Ordered and Administered this Visit   Medications  methylPREDNISolone sodium succinate (SOLU-MEDROL) 40 mg/mL injection 80 mg (80 mg Intramuscular Given 07/02/16 1921)  ketorolac (TORADOL) injection 60 mg (60 mg Intramuscular Given 07/02/16 1922)    BP 109/77 (BP Location: Left Arm)   Pulse 94   Temp 98.5 F (36.9 C) (Oral)   Resp 16   Ht 5\' 2"  (1.575 m)   Wt 147 lb (66.7 kg)   LMP 06/11/2016 (Approximate)   SpO2 99%   BMI 26.89 kg/m  No data found.   Physical Exam  Constitutional: She is oriented to person, place, and time. She appears well-developed and well-nourished. No distress.  Pt sitting on exam table, NAD.  HENT:  Head:  Normocephalic and atraumatic.  Eyes: EOM are normal.  Neck: Normal range of motion.  Cardiovascular: Normal rate.   Pulmonary/Chest: Effort normal.  Musculoskeletal: Normal range of motion. She exhibits tenderness. She exhibits no edema.  No midline spinal tenderness. Tenderness to Left lower lumbar muscles, Left buttock, and Left thigh. Unable to perform Right straight leg raise as it "puts too much pressure on Left leg"  Negative straight leg raise on Left.  Neurological: She is alert and oriented to person, place, and time.  Reflex Scores:      Patellar reflexes are 2+ on the right side and 2+ on the left side. Skin: Skin is warm and dry. Capillary refill takes less than 2 seconds. No rash noted. She is not diaphoretic. No erythema.  Psychiatric: She has a normal mood and affect. Her behavior is normal.  Nursing note and vitals reviewed.   Urgent Care Course   Clinical Course    Procedures (including critical care time)  Labs Review Labs Reviewed - No data to display  Imaging Review No results found.    MDM   1. Acute left-sided low back pain with left-sided sciatica    Pt c/o Left lower back pain that radiates down Left thigh.  Pain started after she made a leaning and twisting motion about 7 days ago. No bony tenderness. No red flag symptoms.   No indication for imaging at this time. Tx in UC: Toradol 60mg  IM and Solumedrol 80mg  IM  Rx: Prednisone 2 week taper (has been on prednisone for allergic rxn in past w/o side effects), Robaxin, ibuprofen, and norco for severe pain.  Home care instructions provided. Encouraged to schedule f/u appointment with Sports Medicine in 2 weeks, she may cancel if feeling better.    Noland Fordyce, PA-C 07/02/16 1939

## 2016-07-02 NOTE — Discharge Instructions (Signed)
°  Norco/Vicodin (hydrocodone-acetaminophen) is a narcotic pain medication, do not combine these medications with others containing tylenol. While taking, do not drink alcohol, drive, or perform any other activities that requires focus while taking these medications.   Robaxin (methocarbamol) is a muscle relaxer and may cause drowsiness. Do not drink alcohol, drive, or operate heavy machinery while taking.  You were given a shot of solumedrol (a steroid) today to help with muscle pain and swelling.  You have been prescribed prednisone, an oral steroid.  You may start this medication tomorrow with breakfast.    It is recommended you call to schedule an appointment with Sports Medicine 2 weeks from now as you can always cancel the appointment if you are feeling better, however, it may be difficult to get in last minute as appointments fill rather quickly at times.

## 2016-10-20 ENCOUNTER — Ambulatory Visit (INDEPENDENT_AMBULATORY_CARE_PROVIDER_SITE_OTHER): Payer: BC Managed Care – PPO | Admitting: Physician Assistant

## 2016-10-20 ENCOUNTER — Encounter: Payer: Self-pay | Admitting: Physician Assistant

## 2016-10-20 VITALS — BP 151/84 | HR 88 | Temp 98.1°F | Ht 62.0 in | Wt 148.0 lb

## 2016-10-20 DIAGNOSIS — R52 Pain, unspecified: Secondary | ICD-10-CM | POA: Diagnosis not present

## 2016-10-20 DIAGNOSIS — J029 Acute pharyngitis, unspecified: Secondary | ICD-10-CM | POA: Diagnosis not present

## 2016-10-20 DIAGNOSIS — J069 Acute upper respiratory infection, unspecified: Secondary | ICD-10-CM

## 2016-10-20 LAB — POCT INFLUENZA A/B
Influenza A, POC: NEGATIVE
Influenza B, POC: NEGATIVE

## 2016-10-20 LAB — POCT RAPID STREP A (OFFICE): Rapid Strep A Screen: NEGATIVE

## 2016-10-20 MED ORDER — PHENYLEPHRINE-APAP-GUAIFENESIN 5-325-200 MG PO TABS: ORAL_TABLET | ORAL | 0 refills | Status: DC

## 2016-10-20 NOTE — Patient Instructions (Signed)
Pharyngitis Pharyngitis is redness, pain, and swelling (inflammation) of your pharynx. What are the causes? Pharyngitis is usually caused by infection. Most of the time, these infections are from viruses (viral) and are part of a cold. However, sometimes pharyngitis is caused by bacteria (bacterial). Pharyngitis can also be caused by allergies. Viral pharyngitis may be spread from person to person by coughing, sneezing, and personal items or utensils (cups, forks, spoons, toothbrushes). Bacterial pharyngitis may be spread from person to person by more intimate contact, such as kissing. What are the signs or symptoms? Symptoms of pharyngitis include:  Sore throat.  Tiredness (fatigue).  Low-grade fever.  Headache.  Joint pain and muscle aches.  Skin rashes.  Swollen lymph nodes.  Plaque-like film on throat or tonsils (often seen with bacterial pharyngitis). How is this diagnosed? Your health care provider will ask you questions about your illness and your symptoms. Your medical history, along with a physical exam, is often all that is needed to diagnose pharyngitis. Sometimes, a rapid strep test is done. Other lab tests may also be done, depending on the suspected cause. How is this treated? Viral pharyngitis will usually get better in 3-4 days without the use of medicine. Bacterial pharyngitis is treated with medicines that kill germs (antibiotics). Follow these instructions at home:  Drink enough water and fluids to keep your urine clear or pale yellow.  Only take over-the-counter or prescription medicines as directed by your health care provider:  If you are prescribed antibiotics, make sure you finish them even if you start to feel better.  Do not take aspirin.  Get lots of rest.  Gargle with 8 oz of salt water ( tsp of salt per 1 qt of water) as often as every 1-2 hours to soothe your throat.  Throat lozenges (if you are not at risk for choking) or sprays may be used to  soothe your throat. Contact a health care provider if:  You have large, tender lumps in your neck.  You have a rash.  You cough up green, yellow-brown, or bloody spit. Get help right away if:  Your neck becomes stiff.  You drool or are unable to swallow liquids.  You vomit or are unable to keep medicines or liquids down.  You have severe pain that does not go away with the use of recommended medicines.  You have trouble breathing (not caused by a stuffy nose). This information is not intended to replace advice given to you by your health care provider. Make sure you discuss any questions you have with your health care provider. Document Released: 08/24/2005 Document Revised: 01/30/2016 Document Reviewed: 05/01/2013 Elsevier Interactive Patient Education  2017 [employer]. Upper Respiratory Infection, Adult Most upper respiratory infections (URIs) are caused by a virus. A URI affects the nose, throat, and upper air passages. The most common type of URI is often called "the common cold." Follow these instructions at home:  Take medicines only as told by your doctor.  Gargle warm saltwater or take cough drops to comfort your throat as told by your doctor.  Use a warm mist humidifier or inhale steam from a shower to increase air moisture. This may make it easier to breathe.  Drink enough fluid to keep your pee (urine) clear or pale yellow.  Eat soups and other clear broths.  Have a healthy diet.  Rest as needed.  Go back to work when your fever is gone or your doctor says it is okay.  You may need to  stay home longer to avoid giving your URI to others.  You can also wear a face mask and wash your hands often to prevent spread of the virus.  Use your inhaler more if you have asthma.  Do not use any tobacco products, including cigarettes, chewing tobacco, or electronic cigarettes. If you need help quitting, ask your doctor. Contact a doctor if:  You are getting worse,  not better.  Your symptoms are not helped by medicine.  You have chills.  You are getting more short of breath.  You have brown or red mucus.  You have yellow or brown discharge from your nose.  You have pain in your face, especially when you bend forward.  You have a fever.  You have puffy (swollen) neck glands.  You have pain while swallowing.  You have white areas in the back of your throat. Get help right away if:  You have very bad or constant:  Headache.  Ear pain.  Pain in your forehead, behind your eyes, and over your cheekbones (sinus pain).  Chest pain.  You have long-lasting (chronic) lung disease and any of the following:  Wheezing.  Long-lasting cough.  Coughing up blood.  A change in your usual mucus.  You have a stiff neck.  You have changes in your:  Vision.  Hearing.  Thinking.  Mood. This information is not intended to replace advice given to you by your health care provider. Make sure you discuss any questions you have with your health care provider. Document Released: 02/10/2008 Document Revised: 04/26/2016 Document Reviewed: 11/29/2013 Elsevier Interactive Patient Education  2017 [employer].

## 2016-10-20 NOTE — Progress Notes (Signed)
   Subjective:    Patient ID: Stephanie Hogan, female    DOB: October 24, 1980, 36 y.o.   MRN: [id number]  HPI  Pt is a 36 yo female who presents to the clinic with body aches since Saturday, dry cough, headache, ST that worsened last night. She has hx of asthma but denies any SOB or wheezing. Rates sore throat 10/10 last night. Has some improvement with sore throat today. She has been using chloraseptic spray. She has some minor dizziness.    Review of Systems  All other systems reviewed and are negative.      Objective:   Physical Exam  Constitutional: She is oriented to person, place, and time. She appears well-developed and well-nourished.  HENT:  Head: Normocephalic and atraumatic.  Right Ear: External ear normal.  Left Ear: External ear normal.  Nose: Nose normal.  Mouth/Throat: Oropharynx is clear and moist. No oropharyngeal exudate.  TM's good light reflex with some erythema behind TM.  Negative for tenderness over maxillary/frontal sinuses to palpation.  Oropharynx erythematous without any tonsilar swelling or exudate.   Eyes: Conjunctivae are normal. Right eye exhibits no discharge. Left eye exhibits no discharge.  Neck: Normal range of motion. Neck supple.  Cardiovascular: Normal rate, regular rhythm and normal heart sounds.   Pulmonary/Chest: Effort normal and breath sounds normal. She has no wheezes.  Lymphadenopathy:    She has cervical adenopathy.  Neurological: She is alert and oriented to person, place, and time.  Skin: Skin is dry.  Psychiatric: She has a normal mood and affect. Her behavior is normal.          Assessment & Plan:  Marland KitchenMarland KitchenDuana was seen today for cough, headache, sore throat, generalized body aches and nausea.  Diagnoses and all orders for this visit:  URI with cough and congestion -     Phenylephrine-APAP-Guaifenesin 5-325-200 MG TABS; Take 2 tablets every 6 hours as needed for URI symptoms.  Sore throat -     POCT Influenza A/B -     POCT rapid  strep A -     Phenylephrine-APAP-Guaifenesin 5-325-200 MG TABS; Take 2 tablets every 6 hours as needed for URI symptoms.  Body aches -     POCT Influenza A/B -     Phenylephrine-APAP-Guaifenesin 5-325-200 MG TABS; Take 2 tablets every 6 hours as needed for URI symptoms.   Rapid strep and flu negative.  Discussed viral nature.  Reassurance that lungs, ears look good.  Discussed symptomatic care with salt water gargles, ibuprofen and tylenol cold and sinus severe.  Written out of work for 2 days to rest and hydrate. Follow up as needed.

## 2016-11-17 ENCOUNTER — Ambulatory Visit (INDEPENDENT_AMBULATORY_CARE_PROVIDER_SITE_OTHER): Payer: BC Managed Care – PPO | Admitting: Physician Assistant

## 2016-11-17 ENCOUNTER — Encounter: Payer: Self-pay | Admitting: Physician Assistant

## 2016-11-17 VITALS — BP 117/84 | HR 88 | Temp 97.9°F | Ht 62.0 in | Wt 143.0 lb

## 2016-11-17 DIAGNOSIS — F4320 Adjustment disorder, unspecified: Secondary | ICD-10-CM | POA: Diagnosis not present

## 2016-11-17 DIAGNOSIS — J209 Acute bronchitis, unspecified: Secondary | ICD-10-CM

## 2016-11-17 DIAGNOSIS — F4321 Adjustment disorder with depressed mood: Secondary | ICD-10-CM | POA: Insufficient documentation

## 2016-11-17 DIAGNOSIS — Z13220 Encounter for screening for lipoid disorders: Secondary | ICD-10-CM

## 2016-11-17 DIAGNOSIS — Z131 Encounter for screening for diabetes mellitus: Secondary | ICD-10-CM | POA: Diagnosis not present

## 2016-11-17 MED ORDER — ALBUTEROL SULFATE 108 (90 BASE) MCG/ACT IN AEPB: 2.0000 | INHALATION_SPRAY | RESPIRATORY_TRACT | 1 refills | Status: DC | PRN

## 2016-11-17 MED ORDER — AZITHROMYCIN 250 MG PO TABS
ORAL_TABLET | ORAL | 0 refills | Status: DC
Start: 2016-11-17 — End: 2017-03-05

## 2016-11-17 MED ORDER — CLONAZEPAM 0.5 MG PO TABS: 0.5000 mg | ORAL_TABLET | Freq: Two times a day (BID) | ORAL | 0 refills | Status: DC | PRN

## 2016-11-17 MED ORDER — METHYLPREDNISOLONE SODIUM SUCC 125 MG IJ SOLR
125.0000 mg | Freq: Once | INTRAMUSCULAR | Status: AC
  Administered 2016-11-17: 125 mg via INTRAMUSCULAR

## 2016-11-17 NOTE — Progress Notes (Signed)
   Subjective:    Patient ID: Stephanie Hogan, female    DOB: 1981/06/04, 36 y.o.   MRN: [id number]  HPI  Pt is a 36 yo female who presents to the clinic to discuss cough and nerves.   Cough present for a little over a week. She has asthma but very controlled. She went to use her albuterol inhaler and was out. Cough is productive green/yellow. No fever. She does have some wheezing and SOB. Denies any sinus pressure.   Her dad died last 01/03/23 after a diabetic ulcer got infected and became septic. She is getting married in 3 months. She is struggling to sleep. She just can't stop crying and feeling out of control.  She is scheduled for grief counseling next week. Went back to work today.   She is concerned about diabetes since her dad had it.    Review of Systems  All other systems reviewed and are negative.      Objective:   Physical Exam  Constitutional: She is oriented to person, place, and time. She appears well-developed and well-nourished.  HENT:  Head: Normocephalic and atraumatic.  Right Ear: External ear normal.  Left Ear: External ear normal.  Nose: Nose normal.  Mouth/Throat: Oropharynx is clear and moist. No oropharyngeal exudate.  Eyes: Conjunctivae are normal. Right eye exhibits no discharge. Left eye exhibits no discharge.  Neck: Normal range of motion. Neck supple. No thyromegaly present.  Cardiovascular: Normal rate, regular rhythm and normal heart sounds.   Pulmonary/Chest:  Bilateral wheezing.   Lymphadenopathy:    She has no cervical adenopathy.  Neurological: She is alert and oriented to person, place, and time.  Psychiatric: Her behavior is normal.  Tearful.          Assessment & Plan:  Marland KitchenMarland KitchenDiagnoses and all orders for this visit:  Acute bronchitis, unspecified organism -     Albuterol Sulfate (PROAIR RESPICLICK) 409 (90 Base) MCG/ACT AEPB; Inhale 2 puffs into the lungs every 4 (four) hours as needed (asthma). -     azithromycin (ZITHROMAX) 250 MG  tablet; Take 2 tablets now and the one tablet for 4 days. -     methylPREDNISolone sodium succinate (SOLU-MEDROL) 125 mg/2 mL injection 125 mg; Inject 2 mLs (125 mg total) into the muscle once.  Screening for diabetes mellitus -     COMPLETE METABOLIC PANEL WITH GFR  Screening for lipid disorders -     Lipid panel  Grief reaction -     clonazePAM (KLONOPIN) 0.5 MG tablet; Take 1 tablet (0.5 mg total) by mouth 2 (two) times daily as needed for anxiety.  small quantity of clonazepam.only use when needed. Discussed abuse potential. Continue with counseling. Follow up in one month.   Productive sputum, hx of asthma treated with solumedrol and abx.

## 2016-11-17 NOTE — Patient Instructions (Signed)

## 2017-03-02 ENCOUNTER — Telehealth: Payer: Self-pay | Admitting: Osteopathic Medicine

## 2017-03-02 NOTE — Telephone Encounter (Signed)
If all she needs is prescription to have on hand to treat travelers diarrhea, I'm happy to write that but it sounds like she has multiple concerns which would necessitate an office visit for full travel consultation/preconception counseling.

## 2017-03-02 NOTE — Telephone Encounter (Signed)
Pt called clinic today stating she is going to [place] her honeymoon on July 21. Pt states her fianc called his PCP's office and was given an Rx for "something to prevent the stomach bug." Also questions what immunizations she may need. Pt does not recall getting Hepatitis vaccines, and they are not listed on [facility] where she has received them.  Pt has completed MMR, Polio, dtap, and tdap.   Pt also states they are planning to have children, starting on the honeymoon. Questions what she needs to watch out for.  Will route to Provider in office for review.

## 2017-03-02 NOTE — Telephone Encounter (Signed)
Pt scheduled for appt on Friday for travel consultation

## 2017-03-05 ENCOUNTER — Encounter: Payer: Self-pay | Admitting: Osteopathic Medicine

## 2017-03-05 ENCOUNTER — Ambulatory Visit (INDEPENDENT_AMBULATORY_CARE_PROVIDER_SITE_OTHER): Payer: BC Managed Care – PPO | Admitting: Osteopathic Medicine

## 2017-03-05 VITALS — BP 115/80 | HR 85 | Ht 62.75 in | Wt 151.9 lb

## 2017-03-05 DIAGNOSIS — Z7184 Encounter for health counseling related to travel: Secondary | ICD-10-CM

## 2017-03-05 DIAGNOSIS — Z23 Encounter for immunization: Secondary | ICD-10-CM | POA: Diagnosis not present

## 2017-03-05 DIAGNOSIS — Z7189 Other specified counseling: Secondary | ICD-10-CM | POA: Diagnosis not present

## 2017-03-05 MED ORDER — HEPATITIS A VACCINE 1440 EL U/ML IM SUSP
1.0000 mL | Freq: Once | INTRAMUSCULAR | Status: AC
  Administered 2017-03-05: 1440 [IU] via INTRAMUSCULAR

## 2017-03-05 MED ORDER — TYPHOID VACCINE PO CPDR: 1.0000 | DELAYED_RELEASE_CAPSULE | ORAL | 0 refills | Status: DC

## 2017-03-05 MED ORDER — AZITHROMYCIN 500 MG PO TABS: 1000.0000 mg | ORAL_TABLET | Freq: Once | ORAL | 0 refills | Status: AC

## 2017-03-05 NOTE — Progress Notes (Signed)
HPI: Stephanie Hogan is a 36 y.o. female  who presents to [facility] today, 03/05/17,  for chief complaint of:  Chief Complaint  Patient presents with  . travel appt    Recently married, going to [place] honeymoon in a few weeks and has some questions about what she needs to know for travel safety. Husband is on some kind of every other day until it sounds like oral typhoid vaccine, she thinks he also got a shot of something that isn't sure what. She has a lot of questions given that she wants to try to get pregnant as soon as possible, [place] is a region of the world where the [place] virus infection is a risk. [place] plan on traveling anywhere rural.   Past medical history, surgical history, social history and family history reviewed.  Patient Active Problem List   Diagnosis Date Noted  . Grief reaction 11/17/2016  . ACUTE BRONCHITIS 09/12/2010  . URI 09/05/2008  . ROM 03/22/2008  . ACUTE BRONCHOSPASM 03/15/2008  . ENTERITIS, DUE TO VIRAL NEC 04/15/2007  . SPRAIN/STRAIN, LUMBAR REGION 02/23/2007    Current medication list and allergy/intolerance information reviewed.   Current Outpatient Prescriptions on File Prior to Visit  Medication Sig Dispense Refill  . Albuterol Sulfate (PROAIR RESPICLICK) 932 (90 Base) MCG/ACT AEPB Inhale 2 puffs into the lungs every 4 (four) hours as needed (asthma). (Patient not taking: Reported on 03/05/2017) 1 each 1  . clonazePAM (KLONOPIN) 0.5 MG tablet Take 1 tablet (0.5 mg total) by mouth 2 (two) times daily as needed for anxiety. (Patient not taking: Reported on 03/05/2017) 30 tablet 0   No current facility-administered medications on file prior to visit.    No Known Allergies    Review of Systems:  Constitutional: No recent illness  Exam:  BP 115/80   Pulse 85   Ht 5' 2.75" (1.594 m)   Wt 151 lb 14.4 oz (68.9 kg)   SpO2  98%   BMI 27.12 kg/m   Constitutional: VS see above. General Appearance: alert, well-developed, well-nourished, NAD  Psychiatric: Normal judgment/insight. Anxious mood and affect. Oriented x3.      ASSESSMENT/PLAN: The primary encounter diagnosis was Counseling about travel. Diagnoses of Need for prophylactic vaccination with typhoid-paratyphoid alone (TAB), Need for vaccination against hepatitis A, and Counseling on health promotion and disease prevention were also pertinent to this visit.  Extensive discussion with the patient regarding reasonable precautions to reduce risks of infection with communicable diseases, whether through mosquito bite, contaminated food/water, other. She has some difficulty making up her mind about what measures she would like to take, I advise reasonable precautions based on where she is going would center around and sharing minimization of contaminated food/water, wearing bug spray to prevent mosquito bite. Any preventive measure is not going to be 100% reliable but risk reduction is the goal here.   Patient Instructions  Travel information:   See CDC web site for travel advisories and recommendations for staying healthy while abroad: [employer]ee  Hepatitis A: vaccination now is an option, second booster vaccine will be due in 6-12 months from the first injection   Typhoid: you are low risk for this and vaccines are not 100% effective. You have the option to vaccinate - Rx written for oral vaccine, or our [place] Dept downstairs has it available for a fee - most insurance doesn't cover  it. If you're interested in the shot, it's about $200/person - can call their office at [phone]  Malaria: you will not be traveling to a high-risk area for Malaria, I don't think prevention is needed   Prescription also given to fill for antibiotics to take in case of diarrhea. These are safe if pregnant.   Zika information: If both partners travel to an  area with risk of Zika   The couple should consider using condoms or not having sex for at least 6 months  After returning from an area with risk of Zika, even if they don't have symptoms, or  From the start of the female partner's symptoms or the date he was diagnosed with [place]  The timeframes that men and women should consider waiting are different because Zika can stay in semen longer than in other body fluids.  Food & Water precautions, Mosquito Bite prevention: see CDC web site      Follow-up plan: Return for second Hepatitis A vaccine in 6-12 months .  Visit summary with medication list and pertinent instructions was printed for patient to review, alert [place] if any changes needed. All questions at time of visit were answered - patient instructed to contact office with any additional concerns. ER/RTC precautions were reviewed with the patient and understanding verbalized.   Note: Total time spent 25 minutes, greater than 50% of the visit was spent face-to-face counseling and coordinating care for the following: The primary encounter diagnosis was Counseling about travel. Diagnoses of Need for prophylactic vaccination with typhoid-paratyphoid alone (TAB), Need for vaccination against hepatitis A, and Counseling on health promotion and disease prevention were also pertinent to this visit.Marland Kitchen

## 2017-03-05 NOTE — Patient Instructions (Addendum)
Travel information:   See CDC web site for travel advisories and recommendations for staying healthy while abroad: [employer]  Hepatitis A: vaccination now is an option, second booster vaccine will be due in 6-12 months from the first injection   Typhoid: you are low risk for this and vaccines are not 100% effective. You have the option to vaccinate - Rx written for oral vaccine, or our [facility] Dept downstairs has it available for a fee - most insurance doesn't cover it. If you're interested in the shot, it's about $200/person - can call their office at [phone]  Malaria: you will not be traveling to a high-risk area for Malaria, I don't think prevention is needed   Prescription also given to fill for antibiotics to take in case of diarrhea. These are safe if pregnant.   Zika information: If both partners travel to an area with risk of Zika   The couple should consider using condoms or not having sex for at least 6 months  After returning from an area with risk of Zika, even if they don't have symptoms, or  From the start of the female partner's symptoms or the date he was diagnosed with [place]  The timeframes that men and women should consider waiting are different because Zika can stay in semen longer than in other body fluids.  Food & Water precautions, Mosquito Bite prevention: see CDC web site

## 2017-03-23 ENCOUNTER — Telehealth: Payer: Self-pay

## 2017-03-23 NOTE — Telephone Encounter (Signed)
Pt states she has a history of rases during the summer and she has another one under armpits. Pt would like to know if she can get a refill without making an appointment. Please advise. Luvenia Starch pt)

## 2017-03-23 NOTE — Telephone Encounter (Signed)
Need to know specific med requested - she has a few creams on file historically. OK to route refill to me for x30 days supply and I'll review it. Probably ok to refill based on the Rx's I see on file, but rash still a problem after 2 weeks she will need an appt

## 2017-03-24 ENCOUNTER — Other Ambulatory Visit: Payer: Self-pay

## 2017-03-24 MED ORDER — TRIAMCINOLONE ACETONIDE 0.1 % EX CREA: 1.0000 "application " | TOPICAL_CREAM | Freq: Two times a day (BID) | CUTANEOUS | 0 refills | Status: DC

## 2017-03-24 NOTE — Telephone Encounter (Signed)
Left VM to call back with exact medications.

## 2017-03-24 NOTE — Telephone Encounter (Signed)
Pt requested the cream, medication sent to pharmacy of pt choice.

## 2017-04-30 ENCOUNTER — Telehealth: Payer: Self-pay

## 2017-04-30 NOTE — Telephone Encounter (Signed)
Pt called stating that she went to [facility] on 04/29/2017 for nausea, vomiting, dysuria, and lower back pain and was diagnosed with a severe UTI. She was prescribed cefdinir and Ondansetron. Pt states that she just started medications this morning and is concerned because she is still having symptoms. Advised pt that it is typical to still be experiencing symptoms as she just started taking medication. Advised pt to FU with Donella Stade, PA-C  Monday if she is still experiencing symptoms and that the urgent care and ED is available to her over the weekend if symptoms worsen. Also advised pt to contact [facility] and see if they did a urine culture and if so to update [place] with the results. Pt verbalized understanding.

## 2017-06-01 ENCOUNTER — Ambulatory Visit (INDEPENDENT_AMBULATORY_CARE_PROVIDER_SITE_OTHER): Payer: BC Managed Care – PPO | Admitting: Family Medicine

## 2017-06-01 ENCOUNTER — Encounter: Payer: Self-pay | Admitting: Family Medicine

## 2017-06-01 VITALS — BP 120/88 | HR 88 | Temp 98.1°F | Wt 159.0 lb

## 2017-06-01 DIAGNOSIS — J0101 Acute recurrent maxillary sinusitis: Secondary | ICD-10-CM

## 2017-06-01 MED ORDER — PREDNISONE 10 MG PO TABS: 30.0000 mg | ORAL_TABLET | Freq: Every day | ORAL | 0 refills | Status: DC

## 2017-06-01 MED ORDER — CEFDINIR 300 MG PO CAPS: 300.0000 mg | ORAL_CAPSULE | Freq: Two times a day (BID) | ORAL | 0 refills | Status: DC

## 2017-06-01 NOTE — Patient Instructions (Signed)
Thank you for coming in today. Take prednisone now.  Continue the over the counter cold medicines.  Take the backup omnicef (cefdnir) prescription of antibiotics if worsening.  Return as needed.  Call or go to the emergency room if you get worse, have trouble breathing, have chest pains, or palpitations.    Sinusitis, Adult Sinusitis is soreness and inflammation of your sinuses. Sinuses are hollow spaces in the bones around your face. They are located:  Around your eyes.  In the middle of your forehead.  Behind your nose.  In your cheekbones.  Your sinuses and nasal passages are lined with a stringy fluid (mucus). Mucus normally drains out of your sinuses. When your nasal tissues get inflamed or swollen, the mucus can get trapped or blocked so air cannot flow through your sinuses. This lets bacteria, viruses, and funguses grow, and that leads to infection. Follow these instructions at home: Medicines  Take, use, or apply over-the-counter and prescription medicines only as told by your doctor. These may include nasal sprays.  If you were prescribed an antibiotic medicine, take it as told by your doctor. Do not stop taking the antibiotic even if you start to feel better. Hydrate and Humidify  Drink enough water to keep your pee (urine) clear or pale yellow.  Use a cool mist humidifier to keep the humidity level in your home above 50%.  Breathe in steam for 10-15 minutes, 3-4 times a day or as told by your doctor. You can do this in the bathroom while a hot shower is running.  Try not to spend time in cool or dry air. Rest  Rest as much as possible.  Sleep with your head raised (elevated).  Make sure to get enough sleep each night. General instructions  Put a warm, moist washcloth on your face 3-4 times a day or as told by your doctor. This will help with discomfort.  Wash your hands often with soap and water. If there is no soap and water, use hand sanitizer.  Do not  smoke. Avoid being around people who are smoking (secondhand smoke).  Keep all follow-up visits as told by your doctor. This is important. Contact a doctor if:  You have a fever.  Your symptoms get worse.  Your symptoms do not get better within 10 days. Get help right away if:  You have a very bad headache.  You cannot stop throwing up (vomiting).  You have pain or swelling around your face or eyes.  You have trouble seeing.  You feel confused.  Your neck is stiff.  You have trouble breathing. This information is not intended to replace advice given to you by your health care provider. Make sure you discuss any questions you have with your health care provider. Document Released: 02/10/2008 Document Revised: 04/19/2016 Document Reviewed: 06/19/2015 Elsevier Interactive Patient Education  [employer].

## 2017-06-02 NOTE — Progress Notes (Signed)
Stephanie Hogan is a 36 y.o. female who presents to [facility]: [place] today for facial pain and pressure coughing congestion. Symptoms present for a few days and are consistent with early episodes of sinusitis and bronchitis that she typically gets every year. She has tried some over-the-counter medications which have helped him but her symptoms are still quite significant. She thinks she is getting worse over the last several days.   Past Medical History:  Diagnosis Date  . Asthma   . Ovarian cyst    Past Surgical History:  Procedure Laterality Date  . CHOLECYSTECTOMY    . KNEE SURGERY    . MOLE REMOVAL     Social History  Substance Use Topics  . Smoking status: Never Smoker  . Smokeless tobacco: Never Used  . Alcohol use 1.0 oz/week    2 drink(s) per week     Comment: Occasional   family history includes Alzheimer's disease in her paternal grandfather; Breast cancer in her maternal grandmother; Lung cancer in her maternal grandfather.  ROS as above:  Medications: Current Outpatient Prescriptions  Medication Sig Dispense Refill  . Albuterol Sulfate (PROAIR RESPICLICK) 951 (90 Base) MCG/ACT AEPB Inhale 2 puffs into the lungs every 4 (four) hours as needed (asthma). 1 each 1  . clonazePAM (KLONOPIN) 0.5 MG tablet Take 1 tablet (0.5 mg total) by mouth 2 (two) times daily as needed for anxiety. 30 tablet 0  . triamcinolone cream (KENALOG) 0.1 % Apply 1 application topically 2 (two) times daily. As needed for redness and itching. Limit use on face. Use sun protection. 30 g 0  . cefdinir (OMNICEF) 300 MG capsule Take 1 capsule (300 mg total) by mouth 2 (two) times daily. 14 capsule 0  . predniSONE (DELTASONE) 10 MG tablet Take 3 tablets (30 mg total) by mouth daily with breakfast. 15 tablet 0   No current facility-administered medications for this visit.    No Known  Allergies  Health Maintenance Health Maintenance  Topic Date Due  . HIV Screening  06/08/1995  . PAP SMEAR  02/12/2017  . INFLUENZA VACCINE  04/07/2017  . TETANUS/TDAP  05/15/2024     Exam:  BP 120/88   Pulse 88   Temp 98.1 F (36.7 C) (Oral)   Wt 159 lb (72.1 kg)   SpO2 100%   BMI 28.39 kg/m  Gen: Well NAD HEENT: EOMI,  MMM Tender palpation bilateral maxillary sinus. Normal tympanic membranes. Posterior pharynx with cobblestoning. Lungs: Normal work of breathing. Slight wheeze present bilaterally Heart: RRR no MRG Abd: NABS, Soft. Nondistended, Nontender Exts: Brisk capillary refill, warm and well perfused.    No results found for this or any previous visit (from the past 72 hour(s)). No results found.    Assessment and Plan: 36 y.o. female with viral URI with developing sinusitis and potential bronchitis. Patient has already tried over-the-counter medications and has had incomplete results. Plan to treat with a short course of prednisone and have a backup prescription of Omnicef the patient can take if she does not improve. Recheck if not improving or worsening.   No orders of the defined types were placed in this encounter.  Meds ordered this encounter  Medications  . predniSONE (DELTASONE) 10 MG tablet    Sig: Take 3 tablets (30 mg total) by mouth daily with breakfast.    Dispense:  15 tablet    Refill:  0  . cefdinir (OMNICEF) 300 MG capsule  Sig: Take 1 capsule (300 mg total) by mouth 2 (two) times daily.    Dispense:  14 capsule    Refill:  0     Discussed warning signs or symptoms. Please see discharge instructions. Patient expresses understanding.  I spent 25 minutes with this patient, greater than 50% was face-to-face time counseling regarding ddx and treatment plan.

## 2017-06-14 ENCOUNTER — Encounter: Payer: Self-pay | Admitting: Physician Assistant

## 2017-06-14 ENCOUNTER — Ambulatory Visit (INDEPENDENT_AMBULATORY_CARE_PROVIDER_SITE_OTHER): Payer: BC Managed Care – PPO | Admitting: Physician Assistant

## 2017-06-14 VITALS — BP 123/72 | HR 76 | Temp 98.2°F | Ht 62.75 in | Wt 160.0 lb

## 2017-06-14 DIAGNOSIS — H6981 Other specified disorders of Eustachian tube, right ear: Secondary | ICD-10-CM

## 2017-06-14 DIAGNOSIS — J0101 Acute recurrent maxillary sinusitis: Secondary | ICD-10-CM | POA: Diagnosis not present

## 2017-06-14 MED ORDER — AMOXICILLIN-POT CLAVULANATE 875-125 MG PO TABS: 1.0000 | ORAL_TABLET | Freq: Two times a day (BID) | ORAL | 0 refills | Status: DC

## 2017-06-14 MED ORDER — PREDNISONE 20 MG PO TABS: ORAL_TABLET | ORAL | 0 refills | Status: DC

## 2017-06-14 NOTE — Patient Instructions (Signed)
Eustachian Tube Dysfunction The eustachian tube connects the middle ear to the back of the nose. It regulates air pressure in the middle ear by allowing air to move between the ear and nose. It also helps to drain fluid from the middle ear space. When the eustachian tube does not function properly, air pressure, fluid, or both can build up in the middle ear. Eustachian tube dysfunction can affect one or both ears. What are the causes? This condition happens when the eustachian tube becomes blocked or cannot open normally. This may result from:  Ear infections.  Colds and other upper respiratory infections.  Allergies.  Irritation, such as from cigarette smoke or acid from the stomach coming up into the esophagus (gastroesophageal reflux).  Sudden changes in air pressure, such as from descending in an airplane.  Abnormal growths in the nose or throat, such as nasal polyps, tumors, or enlarged tissue at the back of the throat (adenoids).  What increases the risk? This condition may be more likely to develop in people who smoke and people who are overweight. Eustachian tube dysfunction may also be more likely to develop in children, especially children who have:  Certain birth defects of the mouth, such as cleft palate.  Large tonsils and adenoids.  What are the signs or symptoms? Symptoms of this condition may include:  A feeling of fullness in the ear.  Ear pain.  Clicking or popping noises in the ear.  Ringing in the ear.  Hearing loss.  Loss of balance.  Symptoms may get worse when the air pressure around you changes, such as when you travel to an area of high elevation or fly on an airplane. How is this diagnosed? This condition may be diagnosed based on:  Your symptoms.  A physical exam of your ear, nose, and throat.  Tests, such as those that measure: ? The movement of your eardrum (tympanogram). ? Your hearing (audiometry).  How is this treated? Treatment  depends on the cause and severity of your condition. If your symptoms are mild, you may be able to relieve your symptoms by moving air into ("popping") your ears. If you have symptoms of fluid in your ears, treatment may include:  Decongestants.  Antihistamines.  Nasal sprays or ear drops that contain medicines that reduce swelling (steroids).  In some cases, you may need to have a procedure to drain the fluid in your eardrum (myringotomy). In this procedure, a small tube is placed in the eardrum to:  Drain the fluid.  Restore the air in the middle ear space.  Follow these instructions at home:  Take over-the-counter and prescription medicines only as told by your health care provider.  Use techniques to help pop your ears as recommended by your health care provider. These may include: ? Chewing gum. ? Yawning. ? Frequent, forceful swallowing. ? Closing your mouth, holding your nose closed, and gently blowing as if you are trying to blow air out of your nose.  Do not do any of the following until your health care provider approves: ? Travel to high altitudes. ? Fly in airplanes. ? Work in a pressurized cabin or room. ? Scuba dive.  Keep your ears dry. Dry your ears completely after showering or bathing.  Do not smoke.  Keep all follow-up visits as told by your health care provider. This is important. Contact a health care provider if:  Your symptoms do not go away after treatment.  Your symptoms come back after treatment.  You are   unable to pop your ears.  You have: ? A fever. ? Pain in your ear. ? Pain in your head or neck. ? Fluid draining from your ear.  Your hearing suddenly changes.  You become very dizzy.  You lose your balance. This information is not intended to replace advice given to you by your health care provider. Make sure you discuss any questions you have with your health care provider. Document Released: 09/20/2015 Document Revised: 01/30/2016  Document Reviewed: 09/12/2014 Elsevier Interactive Patient Education  2018 Elsevier Inc.  

## 2017-06-14 NOTE — Progress Notes (Signed)
   Subjective:    Patient ID: Stephanie Hogan, female    DOB: Feb 25, 1981, 36 y.o.   MRN: [id number]  HPI  Pt is a 36 yo female who presents to the clinic with right ear pain. On 9/25 pt was treated for acute recurrent maxillary sinusitis with prednisone and omnicef. She finished prednisone but did throw away abx after only 2 days of taking it. She denies any fever, chills. Her right ear has been hurting all weekend. She denies any discharge from ear. No hearing loss. Her right eye is swollen and tender to touch. She would like to get abx to finish her treatment.   .. Active Ambulatory Problems    Diagnosis Date Noted  . ENTERITIS, DUE TO VIRAL NEC 04/15/2007  . ROM 03/22/2008  . URI 09/05/2008  . ACUTE BRONCHOSPASM 03/15/2008  . SPRAIN/STRAIN, LUMBAR REGION 02/23/2007  . ACUTE BRONCHITIS 09/12/2010  . Grief reaction 11/17/2016   Resolved Ambulatory Problems    Diagnosis Date Noted  . No Resolved Ambulatory Problems   Past Medical History:  Diagnosis Date  . Asthma   . Ovarian cyst       Review of Systems  All other systems reviewed and are negative.      Objective:   Physical Exam  Constitutional: She is oriented to person, place, and time. She appears well-developed and well-nourished.  HENT:  Head: Normocephalic and atraumatic.  Right Ear: External ear normal.  Left Ear: External ear normal.  TM"s erythematous with dull light reflex.  Tenderness over maxillary bilateral sinuses.  Turbinates red and swollen.  Oropharynx erythematous without tonsil swelling or exudate.   Eyes: Conjunctivae are normal. Right eye exhibits no discharge. Left eye exhibits no discharge.  Cardiovascular: Normal rate, regular rhythm and normal heart sounds.   Pulmonary/Chest: Effort normal and breath sounds normal. She has no wheezes.  Lymphadenopathy:    She has no cervical adenopathy.  Neurological: She is alert and oriented to person, place, and time.  Psychiatric: She has a normal mood and  affect. Her behavior is normal.          Assessment & Plan:  Marland KitchenMarland KitchenDiagnoses and all orders for this visit:  Acute recurrent maxillary sinusitis -     amoxicillin-clavulanate (AUGMENTIN) 875-125 MG tablet; Take 1 tablet by mouth 2 (two) times daily. For 10 days.  ETD (Eustachian tube dysfunction), right -     amoxicillin-clavulanate (AUGMENTIN) 875-125 MG tablet; Take 1 tablet by mouth 2 (two) times daily. For 10 days. -     predniSONE (DELTASONE) 20 MG tablet; Take 3 tablets for 3 days, take 2 tablets for 3 days, take 1 tablet for 3 days, take 1/2 tablet for 4 days.   Due to throwing away rx new abx was given. Please take all of abx. Discussed symptomatic care. Prednisone longer taper was given. Follow up if not improving. HO givent. Consider flonase daily and they addition of antihistamine to help get ETD symptoms treated.

## 2017-06-17 DIAGNOSIS — H6991 Unspecified Eustachian tube disorder, right ear: Secondary | ICD-10-CM | POA: Insufficient documentation

## 2017-06-17 DIAGNOSIS — J0101 Acute recurrent maxillary sinusitis: Secondary | ICD-10-CM | POA: Insufficient documentation

## 2017-06-17 DIAGNOSIS — H6981 Other specified disorders of Eustachian tube, right ear: Secondary | ICD-10-CM | POA: Insufficient documentation

## 2017-08-17 ENCOUNTER — Ambulatory Visit: Payer: BC Managed Care – PPO | Admitting: Obstetrics and Gynecology

## 2017-09-13 ENCOUNTER — Ambulatory Visit: Payer: BC Managed Care – PPO

## 2017-09-15 ENCOUNTER — Ambulatory Visit (INDEPENDENT_AMBULATORY_CARE_PROVIDER_SITE_OTHER): Payer: BC Managed Care – PPO | Admitting: Physician Assistant

## 2017-09-15 VITALS — BP 114/82 | HR 74 | Temp 98.9°F | Resp 16 | Wt 164.4 lb

## 2017-09-15 DIAGNOSIS — Z23 Encounter for immunization: Secondary | ICD-10-CM

## 2017-09-15 MED ORDER — HEPATITIS A VACCINE 1440 EL U/ML IM SUSP
1.0000 mL | Freq: Once | INTRAMUSCULAR | Status: AC
  Administered 2017-09-15: 1440 [IU] via INTRAMUSCULAR

## 2017-09-15 NOTE — Progress Notes (Signed)
HPI - Patient is here for a 2nd Hepatitis A vaccination. Patient stated she had no reactions from her first vaccination. Patient denies chest pain, shortness of breath, headaches, fever or any other problems with this medication.   Assessment and Plan-  2nd Hepatitis A Left deltoid. Patient tolerated vaccination well without any complications. Patient advised that she received Rocephin 1 g in her left arm on 09/06/17 from [facility] after receiving vaccination. Patient stated she also had a rash on the inside of her left arm that she feels is just dry skin. Patient was advised by provider that antibiotics are typically administered in the buttocks and not arm. Patient was advised to keep a watch for any changes to her left arm - to contact our office if she does. Patient stated she understood and would.  Agree with above plan. Iran Planas PA-C

## 2018-01-18 ENCOUNTER — Emergency Department (INDEPENDENT_AMBULATORY_CARE_PROVIDER_SITE_OTHER)
Admission: EM | Admit: 2018-01-18 | Discharge: 2018-01-18 | Disposition: A | Discharge status: Home / Self Care | Payer: BC Managed Care – PPO | Source: Home / Self Care | Attending: Family Medicine | Admitting: Family Medicine

## 2018-01-18 ENCOUNTER — Encounter: Payer: Self-pay | Admitting: *Deleted

## 2018-01-18 ENCOUNTER — Other Ambulatory Visit: Payer: Self-pay

## 2018-01-18 DIAGNOSIS — R350 Frequency of micturition: Secondary | ICD-10-CM | POA: Diagnosis not present

## 2018-01-18 DIAGNOSIS — N3001 Acute cystitis with hematuria: Secondary | ICD-10-CM

## 2018-01-18 LAB — POCT URINALYSIS DIP (MANUAL ENTRY)
Bilirubin, UA: NEGATIVE
Glucose, UA: NEGATIVE mg/dL
Ketones, POC UA: NEGATIVE mg/dL
Nitrite, UA: POSITIVE — AB
Spec Grav, UA: 1.015 (ref 1.010–1.025)
Urobilinogen, UA: 0.2 E.U./dL
pH, UA: 6 (ref 5.0–8.0)

## 2018-01-18 LAB — POCT URINE PREGNANCY: Preg Test, Ur: NEGATIVE

## 2018-01-18 MED ORDER — CEPHALEXIN 500 MG PO CAPS: 500.0000 mg | ORAL_CAPSULE | Freq: Two times a day (BID) | ORAL | 0 refills | Status: DC

## 2018-01-18 NOTE — ED Triage Notes (Signed)
Pt c/o intermittent fever x 1 wk that resolved on Sunday; she now c/o nausea, LBP, and frequent urination x 4 days. She had IUI on 01/11/18.

## 2018-01-18 NOTE — ED Provider Notes (Signed)
[name]    CSN: [id number] Arrival date & time: 01/18/18  1859     History   Chief Complaint Chief Complaint  Patient presents with  . Back Pain    HPI Stephanie Hogan is a 38 y.o. female.   HPI Stephanie Hogan is a 37 y.o. female presenting to UC with c/o intermittent fever Tmax 101*F for about 1 week.  Fever resolved 2 days ago.  She is now c/o mild intermittent nausea, urinary frequency and Right side low back pain for about 4 days.  She had IUI on 01/11/18.  She was advised to take a home urine pregnancy test 2 weeks from the procedure.  She has had UTIs in the past. Symptoms feel similar. Last UTI was about 5-6 weeks ago.   Past Medical History:  Diagnosis Date  . Asthma   . Ovarian cyst     Patient Active Problem List   Diagnosis Date Noted  . ETD (Eustachian tube dysfunction), right 06/17/2017  . Acute recurrent maxillary sinusitis 06/17/2017  . Grief reaction 11/17/2016  . ACUTE BRONCHITIS 09/12/2010  . URI 09/05/2008  . ROM 03/22/2008  . ACUTE BRONCHOSPASM 03/15/2008  . ENTERITIS, DUE TO VIRAL NEC 04/15/2007  . SPRAIN/STRAIN, LUMBAR REGION 02/23/2007    Past Surgical History:  Procedure Laterality Date  . CHOLECYSTECTOMY    . KNEE SURGERY    . MOLE REMOVAL      OB History    Gravida  1   Para  0   Term  0   Preterm  0   AB  0   Living  0     SAB  0   TAB  0   Ectopic  0   Multiple  0   Live Births               Home Medications    Prior to Admission medications   Medication Sig Start Date End Date Taking? Authorizing Provider  ondansetron (ZOFRAN) 4 MG tablet Take 4 mg by mouth every 8 (eight) hours as needed for nausea or vomiting.   Yes [provider]  Albuterol Sulfate (PROAIR RESPICLICK) 660 (90 Base) MCG/ACT AEPB Inhale 2 puffs into the lungs every 4 (four) hours as needed (asthma). 11/17/16   Breeback, Jade L, PA-C  cephALEXin (KEFLEX) 500 MG capsule Take 1 capsule (500 mg total) by mouth 2 (two) times  daily. 01/18/18   Noe Gens, PA-C    Family History Family History  Problem Relation Age of Onset  . Lung cancer Maternal Grandfather   . Breast cancer Maternal Grandmother   . Alzheimer's disease Paternal Grandfather     Social History Social History   Tobacco Use  . Smoking status: Never Smoker  . Smokeless tobacco: Never Used  Substance Use Topics  . Alcohol use: Yes    Alcohol/week: 1.0 oz    Types: 2 Standard drinks or equivalent per week    Comment: Occasional  . Drug use: No     Allergies   Patient has no known allergies.   Review of Systems Review of Systems  Constitutional: Positive for fever. Negative for chills.  Gastrointestinal: Positive for abdominal pain (mild cramping) and nausea. Negative for diarrhea and vomiting.  Genitourinary: Positive for frequency and urgency. Negative for decreased urine volume, dysuria, pelvic pain, vaginal bleeding, vaginal discharge and vaginal pain.  Musculoskeletal: Positive for back pain. Negative for myalgias.     Physical Exam Triage Vital Signs ED Triage Vitals  Enc Vitals Group     BP 01/18/18 1922 126/87     Pulse Rate 01/18/18 1922 100     Resp 01/18/18 1922 16     Temp 01/18/18 1922 98.7 F (37.1 C)     Temp Source 01/18/18 1922 Oral     SpO2 01/18/18 1922 100 %     Weight 01/18/18 1924 161 lb (73 kg)     Height 01/18/18 1924 5\' 2"  (1.575 m)     Head Circumference --      Peak Flow --      Pain Score 01/18/18 1922 0     Pain Loc --      Pain Edu? --      Excl. in [place]? --    No data found.  Updated Vital Signs BP 126/87 (BP Location: Right Arm)   Pulse 100   Temp 98.7 F (37.1 C) (Oral)   Resp 16   Ht 5\' 2"  (1.575 m)   Wt 161 lb (73 kg)   LMP 12/29/2017   SpO2 100%   BMI 29.45 kg/m   Visual Acuity Right Eye Distance:   Left Eye Distance:   Bilateral Distance:    Right Eye Near:   Left Eye Near:    Bilateral Near:     Physical Exam  Constitutional: She is oriented to person,  place, and time. She appears well-developed and well-nourished. No distress.  HENT:  Head: Normocephalic and atraumatic.  Mouth/Throat: Oropharynx is clear and moist.  Eyes: EOM are normal.  Neck: Normal range of motion.  Cardiovascular: Normal rate and regular rhythm.  Pulmonary/Chest: Effort normal and breath sounds normal. No stridor. No respiratory distress. She has no wheezes. She has no rales.  Abdominal: Soft. She exhibits no distension. There is no tenderness. There is no CVA tenderness.  Musculoskeletal: Normal range of motion.  Neurological: She is alert and oriented to person, place, and time.  Skin: Skin is warm and dry. She is not diaphoretic.  Psychiatric: She has a normal mood and affect. Her behavior is normal.  Nursing note and vitals reviewed.    UC Treatments / Results  Labs (all labs ordered are listed, but only abnormal results are displayed) Labs Reviewed  POCT URINALYSIS DIP (MANUAL ENTRY) - Abnormal; Notable for the following components:      Result Value   Clarity, UA cloudy (*)    Blood, UA small (*)    Protein Ur, POC trace (*)    Nitrite, UA Positive (*)    Leukocytes, UA Trace (*)    All other components within normal limits  URINE CULTURE  POCT URINE PREGNANCY    EKG None  Radiology No results found.  Procedures Procedures (including critical care time)  Medications Ordered in UC Medications - No data to display  Initial Impression / Assessment and Plan / UC Course  I have reviewed the triage vital signs and the nursing notes.  Pertinent labs & imaging results that were available during my care of the patient were reviewed by me and considered in my medical decision making (see chart for details).     UA c/w UTI Culture sent Urine preg was performed in triage. Negative.  F/u with PCP or OB/GYN   Final Clinical Impressions(s) / UC Diagnoses   Final diagnoses:  Urinary frequency  Acute cystitis with hematuria   Discharge  Instructions   None    ED Prescriptions    Medication Sig Dispense Auth. Provider   cephALEXin [facility])  500 MG capsule Take 1 capsule (500 mg total) by mouth 2 (two) times daily. 14 capsule Noe Gens, PA-C     Controlled Substance Prescriptions [place] Controlled Substance Registry consulted? Not Applicable   Tyrell Antonio 01/18/18 1944

## 2018-01-20 ENCOUNTER — Telehealth: Payer: Self-pay | Admitting: *Deleted

## 2018-01-20 LAB — URINE CULTURE
MICRO NUMBER:: 90586630
SPECIMEN QUALITY:: ADEQUATE

## 2018-01-20 NOTE — Telephone Encounter (Signed)
Callback: Patient reports she is improving. UCX results given. Encouraged to complete course of antibiotics.

## 2018-03-23 ENCOUNTER — Other Ambulatory Visit: Payer: Self-pay | Admitting: Physician Assistant

## 2018-03-23 ENCOUNTER — Other Ambulatory Visit (HOSPITAL_BASED_OUTPATIENT_CLINIC_OR_DEPARTMENT_OTHER): Payer: BC Managed Care – PPO

## 2018-03-23 ENCOUNTER — Ambulatory Visit (HOSPITAL_BASED_OUTPATIENT_CLINIC_OR_DEPARTMENT_OTHER)
Admission: RE | Admit: 2018-03-23 | Discharge: 2018-03-23 | Disposition: A | Discharge status: Home / Self Care | Payer: BC Managed Care – PPO | Source: Ambulatory Visit | Attending: Physician Assistant | Admitting: Physician Assistant

## 2018-03-23 DIAGNOSIS — Z3A01 Less than 8 weeks gestation of pregnancy: Secondary | ICD-10-CM | POA: Insufficient documentation

## 2018-03-23 DIAGNOSIS — O200 Threatened abortion: Secondary | ICD-10-CM

## 2018-03-23 NOTE — Progress Notes (Signed)
Pt called [redacted] weeks pregnant with no vaginal bleeding or abdominal pain. OB u/s showed no heart beat or fetal growth. She wants 2nd opinion before she starts cytomel to have miscarriage.

## 2018-03-24 NOTE — Progress Notes (Signed)
Call pt: normal healthy pregnancy.

## 2018-03-25 ENCOUNTER — Telehealth: Payer: Self-pay | Admitting: Physician Assistant

## 2018-03-25 ENCOUNTER — Other Ambulatory Visit: Payer: Self-pay | Admitting: Physician Assistant

## 2018-03-25 DIAGNOSIS — Z3A01 Less than 8 weeks gestation of pregnancy: Secondary | ICD-10-CM

## 2018-03-25 LAB — HCG, QUANTITATIVE, PREGNANCY: HCG, TOTAL, QN: 48452 m[IU]/mL

## 2018-03-25 NOTE — Progress Notes (Signed)
Call pt: HCG levels are good. Obviously I do not have anything to compare it to. If you could get your last hcg report could compare.

## 2018-03-25 NOTE — Progress Notes (Unsigned)
fertilty continues to tell her no heart beat. [facility] shows heart beat. She needs a ASAP visit with OB [facility] to get there opinion. She has appt august 1st already.   Will check hcg today.   Iran Planas PA-c

## 2018-03-25 NOTE — Telephone Encounter (Signed)
PT went to the second ultrasound. Still came back with no heartbeat.(In contradiction to the ultrasound @ [facility]) Pt requested for you to contact her back on the issue.

## 2018-03-25 NOTE — Progress Notes (Signed)
I called [facility] and they were going to call her and bring her in sooner - CF

## 2018-03-31 ENCOUNTER — Ambulatory Visit (INDEPENDENT_AMBULATORY_CARE_PROVIDER_SITE_OTHER): Payer: BC Managed Care – PPO | Admitting: Obstetrics & Gynecology

## 2018-03-31 DIAGNOSIS — O039 Complete or unspecified spontaneous abortion without complication: Secondary | ICD-10-CM | POA: Diagnosis not present

## 2018-03-31 NOTE — Progress Notes (Signed)
   Subjective:    Patient ID: Stephanie Hogan, female    DOB: [birth year], 37 y.o.   MRN: [id number]  HPI 37 yo G1 here for an u/s because she has been confused by other u/s findings recently. She had an IUI by Dr. Kerin Perna in June 2019. A follow up u/s showed a fetus with no cardiac activity. She then went for a second opinion by a radiologist at [facility]. The u/s there was read as showing a normal fetal cardiac activity. She then went back to Dr. Kerin Perna who confirmed a fetal demise. She denies any vaginal bleeding or pain.   Review of Systems     Objective:   Physical Exam Breathing, conversing, and ambulating normally Well nourished, well hydrated White female, no apparent distress Bedside u/s only shows a yolk sac, NO embryo or cardiac activity     Assessment & Plan:  Miscarriage- I discussed this at length. I offered cytotec but also watchful waiting. She opts for watchful waiting at this point. I have rec'd that she use withdrawal until she has had at least 2 normal periods.

## 2018-04-07 ENCOUNTER — Encounter: Payer: BC Managed Care – PPO | Admitting: Obstetrics & Gynecology

## 2018-04-17 ENCOUNTER — Encounter: Payer: Self-pay | Admitting: Emergency Medicine

## 2018-04-17 ENCOUNTER — Emergency Department
Admission: EM | Admit: 2018-04-17 | Discharge: 2018-04-17 | Disposition: A | Discharge status: Home / Self Care | Payer: BC Managed Care – PPO | Source: Home / Self Care | Attending: Family Medicine | Admitting: Family Medicine

## 2018-04-17 DIAGNOSIS — N390 Urinary tract infection, site not specified: Secondary | ICD-10-CM

## 2018-04-17 LAB — POCT URINALYSIS DIP (MANUAL ENTRY)
Glucose, UA: NEGATIVE mg/dL
Nitrite, UA: POSITIVE — AB
SPEC GRAV UA: 1.025 (ref 1.010–1.025)
Urobilinogen, UA: 1 E.U./dL
pH, UA: 5.5 (ref 5.0–8.0)

## 2018-04-17 MED ORDER — CEPHALEXIN 500 MG PO CAPS: 500.0000 mg | ORAL_CAPSULE | Freq: Two times a day (BID) | ORAL | 0 refills | Status: DC

## 2018-04-17 NOTE — Discharge Instructions (Signed)
°  Please take antibiotics as prescribed and be sure to complete entire course even if you start to feel better to ensure infection does not come back.  Please follow up with family medicine or OB/GYN in 1 week if not improving, sooner if worsening.

## 2018-04-17 NOTE — ED Triage Notes (Signed)
Patient states that she recently had a miscarriage and has been passing it on her own.  Patient is having vomiting, low back pain, foul smelling urine.

## 2018-04-17 NOTE — ED Provider Notes (Signed)
[name]    CSN: [id number] Arrival date & time: 04/17/18  1109     History   Chief Complaint Chief Complaint  Patient presents with  . Fever    HPI Stephanie Hogan is a 37 y.o. female.   HPI  Stephanie Hogan is a 37 y.o. female presenting to UC with c/o 1-2 days of Right low back pain, nausea, vomiting and foul smelling urine. She recently went through a miscarriage after 6 weeks of pregnancy. She has been followed by the fertility clinic who advised pt she has passed everything but the uterine lining remains thickened.  She is still bleeding some. Subjective fever last night with chills and sweats.  Hx of UTIs in the past.    Past Medical History:  Diagnosis Date  . Asthma   . Ovarian cyst     Patient Active Problem List   Diagnosis Date Noted  . ETD (Eustachian tube dysfunction), right 06/17/2017  . Acute recurrent maxillary sinusitis 06/17/2017  . Grief reaction 11/17/2016  . ACUTE BRONCHITIS 09/12/2010  . URI 09/05/2008  . ROM 03/22/2008  . ACUTE BRONCHOSPASM 03/15/2008  . ENTERITIS, DUE TO VIRAL NEC 04/15/2007  . SPRAIN/STRAIN, LUMBAR REGION 02/23/2007    Past Surgical History:  Procedure Laterality Date  . CHOLECYSTECTOMY    . KNEE SURGERY    . MOLE REMOVAL      OB History    Gravida  1   Para  0   Term  0   Preterm  0   AB  0   Living  0     SAB  0   TAB  0   Ectopic  0   Multiple  0   Live Births               Home Medications    Prior to Admission medications   Medication Sig Start Date End Date Taking? Authorizing Provider  Prenatal Multivit-Min-Fe-FA (PRENATAL VITAMINS PO) Take by mouth.   Yes [provider]  cephALEXin (KEFLEX) 500 MG capsule Take 1 capsule (500 mg total) by mouth 2 (two) times daily. 04/17/18   Noe Gens, PA-C    Family History Family History  Problem Relation Age of Onset  . Lung cancer Maternal Grandfather   . Breast cancer Maternal Grandmother   . Alzheimer's disease  Paternal Grandfather     Social History Social History   Tobacco Use  . Smoking status: Never Smoker  . Smokeless tobacco: Never Used  Substance Use Topics  . Alcohol use: Yes    Alcohol/week: 2.0 standard drinks    Types: 2 Standard drinks or equivalent per week    Comment: Occasional  . Drug use: No     Allergies   Patient has no known allergies.   Review of Systems Review of Systems  Constitutional: Positive for chills, diaphoresis and fever ( subjective). Negative for fatigue.  Gastrointestinal: Positive for nausea and vomiting. Negative for diarrhea.  Genitourinary: Positive for frequency, hematuria, pelvic pain (bladder discomfort) and urgency. Negative for dysuria.  Musculoskeletal: Positive for back pain. Negative for myalgias.  Neurological: Negative for dizziness and headaches.     Physical Exam Triage Vital Signs ED Triage Vitals [04/17/18 1138]  Enc Vitals Group     BP 105/71     Pulse Rate (!) 111     Resp      Temp 98.3 F (36.8 C)     Temp Source Oral     SpO2 99 %  Weight 155 lb 8 oz (70.5 kg)     Height 5\' 2"  (1.575 m)     Head Circumference      Peak Flow      Pain Score 0     Pain Loc      Pain Edu?      Excl. in [place]?    No data found.  Updated Vital Signs BP 105/71 (BP Location: Right Arm)   Pulse (!) 111   Temp 98.3 F (36.8 C) (Oral)   Ht 5\' 2"  (1.575 m)   Wt 155 lb 8 oz (70.5 kg)   LMP 04/11/2018 (Exact Date)   SpO2 99%   BMI 28.44 kg/m   Visual Acuity Right Eye Distance:   Left Eye Distance:   Bilateral Distance:    Right Eye Near:   Left Eye Near:    Bilateral Near:     Physical Exam  Constitutional: She is oriented to person, place, and time. She appears well-developed and well-nourished.  HENT:  Head: Normocephalic and atraumatic.  Mouth/Throat: Oropharynx is clear and moist.  Eyes: EOM are normal.  Neck: Normal range of motion.  Cardiovascular: Regular rhythm. Tachycardia present.  Pulmonary/Chest: Effort  normal and breath sounds normal. No stridor. No respiratory distress. She has no wheezes. She has no rales.  Abdominal: Soft. She exhibits no distension. There is tenderness in the suprapubic area. There is no CVA tenderness.  Musculoskeletal: Normal range of motion.  Neurological: She is alert and oriented to person, place, and time.  Skin: Skin is warm and dry.  Psychiatric: She has a normal mood and affect. Her behavior is normal.  Nursing note and vitals reviewed.    UC Treatments / Results  Labs (all labs ordered are listed, but only abnormal results are displayed) Labs Reviewed  POCT URINALYSIS DIP (MANUAL ENTRY) - Abnormal; Notable for the following components:      Result Value   Color, UA other (*)    Bilirubin, UA small (*)    Ketones, POC UA small (15) (*)    Blood, UA large (*)    Protein Ur, POC >=300 (*)    Nitrite, UA Positive (*)    Leukocytes, UA Large (3+) (*)    All other components within normal limits  URINE CULTURE    EKG None  Radiology No results found.  Procedures Procedures (including critical care time)  Medications Ordered in UC Medications - No data to display  Initial Impression / Assessment and Plan / UC Course  I have reviewed the triage vital signs and the nursing notes.  Pertinent labs & imaging results that were available during my care of the patient were reviewed by me and considered in my medical decision making (see chart for details).     UA c/w UTI Urine culture sent Reviewed prior urine culture and visit at [facility] for UTI in 01/2018- pt was on Keflex and appears to have done well. Will start her on Keflex again.  Final Clinical Impressions(s) / UC Diagnoses   Final diagnoses:  Lower urinary tract infection, acute     Discharge Instructions      Please take antibiotics as prescribed and be sure to complete entire course even if you start to feel better to ensure infection does not come back.  Please follow up with  family medicine or OB/GYN in 1 week if not improving, sooner if worsening.    ED Prescriptions    Medication Sig Dispense Auth. Provider   cephALEXin (  KEFLEX) 500 MG capsule Take 1 capsule (500 mg total) by mouth 2 (two) times daily. 14 capsule Noe Gens, PA-C     Controlled Substance Prescriptions [place] Controlled Substance Registry consulted? Not Applicable   Tyrell Antonio 04/17/18 1151

## 2018-04-18 ENCOUNTER — Encounter: Payer: BC Managed Care – PPO | Admitting: Obstetrics & Gynecology

## 2018-04-19 ENCOUNTER — Telehealth: Payer: Self-pay | Admitting: *Deleted

## 2018-04-19 LAB — URINE CULTURE
MICRO NUMBER:: 90951681
SPECIMEN QUALITY:: ADEQUATE

## 2018-04-19 NOTE — Telephone Encounter (Signed)
Callback: No answer left message on mobile VM encouraging to complete antibiotic course based on UCX. Call back as needed.

## 2018-09-26 ENCOUNTER — Ambulatory Visit: Payer: BC Managed Care – PPO | Admitting: Osteopathic Medicine

## 2018-09-26 ENCOUNTER — Encounter: Payer: Self-pay | Admitting: Osteopathic Medicine

## 2018-09-26 DIAGNOSIS — S161XXA Strain of muscle, fascia and tendon at neck level, initial encounter: Secondary | ICD-10-CM | POA: Diagnosis not present

## 2018-09-26 DIAGNOSIS — T148XXA Other injury of unspecified body region, initial encounter: Secondary | ICD-10-CM

## 2018-09-26 DIAGNOSIS — Z789 Other specified health status: Secondary | ICD-10-CM

## 2018-09-26 DIAGNOSIS — Z3200 Encounter for pregnancy test, result unknown: Secondary | ICD-10-CM

## 2018-09-26 DIAGNOSIS — R0781 Pleurodynia: Secondary | ICD-10-CM

## 2018-09-26 NOTE — Patient Instructions (Signed)
The risks of most drug therapy in pregnancy have not been studied extensively. Ask your doctor or your pharmacist about medication safety in pregnancy if you ever have any questions.    In general, use of NSAID's (which also include Ibuprofen/Motrin, Aleve, Aspirin) is NOT recommended. Tylenol is okay. Can use maximum up to 1000 mg up to 4 times a day. Ice is helpful for acute inflammation and swelling. Heat is helpful for increased blood flow to healing muscles.   See printed information.   If you experience symptoms of pregnancy, please come see [place] or your OBGYN.
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
## 2018-10-10 ENCOUNTER — Encounter: Payer: Self-pay | Admitting: Physician Assistant

## 2018-10-10 ENCOUNTER — Ambulatory Visit: Payer: BC Managed Care – PPO | Admitting: Physician Assistant

## 2018-10-10 VITALS — BP 118/78 | HR 78 | Ht 62.0 in | Wt 169.0 lb

## 2018-10-10 DIAGNOSIS — Z3A01 Less than 8 weeks gestation of pregnancy: Secondary | ICD-10-CM

## 2018-10-10 DIAGNOSIS — T148XXA Other injury of unspecified body region, initial encounter: Secondary | ICD-10-CM

## 2018-10-10 NOTE — Patient Instructions (Signed)
Hematoma  A hematoma is a collection of blood under the skin, in an organ, in a body space, in a joint space, or in other tissue. The blood can thicken (clot) to form a lump that you can see and feel. The lump is often firm and may become sore and tender. Most hematomas get better in a few days to weeks. However, some hematomas may be serious and require medical care. Hematomas can range from very small to very large.  What are the causes?  This condition is caused by:   A blunt or penetrating injury.   A leakage from a blood vessel under the skin.   Some medical procedures, including surgeries, such as oral surgery, face lifts, and surgeries on the joints.   Some medical conditions that cause bleeding or bruising. There may be multiple hematomas that appear in different areas of the body.  What increases the risk?  You are more likely to develop this condition if:   You are an older adult.   You use blood thinners.  What are the signs or symptoms?    Symptoms of this condition depend on where the hematoma is located.   Common symptoms of a hematoma that is under the skin include:   A firm lump on the body.   Pain and tenderness in the area.   Bruising. Blue, dark blue, purple-red, or yellowish skin (discoloration) may appear at the site of the hematoma if the hematoma is close to the surface of the skin.  Common symptoms of a hematoma that is deep in the tissues or body spaces may be less obvious. They include:   A collection of blood in the stomach (intra-abdominal hematoma). This may cause pain in the abdomen, weakness, fainting, and shortness of breath.   A collection of blood in the head (intracranial hematoma). This may cause a headache or symptoms such as weakness, trouble speaking or understanding, or a change in consciousness.  How is this diagnosed?  This condition is diagnosed based on:   Your medical history.   A physical exam.   Imaging tests, such as an ultrasound or CT scan. These may  be needed if your health care provider suspects a hematoma in deeper tissues or body spaces.   Blood tests. These may be needed if your health care provider believes that the hematoma is caused by a medical condition.  How is this treated?  Treatment for this condition depends on the cause, size, and location of the hematoma. Treatment may include:   Doing nothing. The majority of hematomas do not need treatment as many of them go away on their own over time.   Surgery or close monitoring. This may be needed for large hematomas or hematomas that affect vital organs.   Medicines. Medicines may be given if there is an underlying medical cause for the hematoma.  Follow these instructions at home:  Managing pain, stiffness, and swelling     If directed, put ice on the affected area.  ? Put ice in a plastic bag.  ? Place a towel between your skin and the bag.  ? Leave the ice on for 20 minutes, 2-3 times a day for the first couple of days.   If directed, apply heat to the affected area after applying ice for a couple of days. Use the heat source that your health care provider recommends, such as a moist heat pack or a heating pad.  ? Place a towel between your skin   and the heat source.  ? Leave the heat on for 20-30 minutes.  ? Remove the heat if your skin turns bright red. This is especially important if you are unable to feel pain, heat, or cold. You may have a greater risk of getting burned.   Raise (elevate) the affected area above the level of your heart while you are sitting or lying down.   If told, wrap the affected area with an elastic bandage. The bandage applies pressure (compression) to the area, which may help to reduce swelling and promote healing. Do not wrap the bandage too tightly around the affected area.   If your hematoma is on a leg or foot (lower extremity) and is painful, your health care provider may recommend crutches. Use them as told by your health care provider.  General  instructions   Take over-the-counter and prescription medicines only as told by your health care provider.   Keep all follow-up visits as told by your health care provider. This is important.  Contact a health care provider if:   You have a fever.   The swelling or discoloration gets worse.   You develop more hematomas.  Get help right away if:   Your pain is worse or your pain is not controlled with medicine.   Your skin over the hematoma breaks or starts bleeding.   Your hematoma is in your chest or abdomen and you have weakness, shortness of breath, or a change in consciousness.   You have a hematoma on your scalp that is caused by a fall or injury, and you also have:  ? A headache that gets worse.  ? Trouble speaking or understanding speech.  ? Weakness.  ? Change in alertness or consciousness.  Summary   A hematoma is a collection of blood under the skin, in an organ, in a body space, in a joint space, or in other tissue.   This condition usually does not need treatment because many hematomas go away on their own over time.   Large hematomas, or those that may affect vital organs, may need surgical drainage or monitoring. If the hematoma is caused by a medical condition, medicines may be prescribed.   Get help right away if your hematoma breaks or starts to bleed, you have shortness of breath, or you have a headache or trouble speaking after a fall.  This information is not intended to replace advice given to you by your health care provider. Make sure you discuss any questions you have with your health care provider.  Document Released: 04/07/2004 Document Revised: 01/27/2018 Document Reviewed: 01/27/2018  Elsevier Interactive Patient Education  2019 [employer]
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
## 2018-10-11 ENCOUNTER — Encounter: Payer: Self-pay | Admitting: Physician Assistant

## 2018-10-11 DIAGNOSIS — T148XXA Other injury of unspecified body region, initial encounter: Secondary | ICD-10-CM | POA: Insufficient documentation

## 2018-11-11 ENCOUNTER — Encounter: Payer: Self-pay | Admitting: *Deleted

## 2019-03-21 ENCOUNTER — Ambulatory Visit (INDEPENDENT_AMBULATORY_CARE_PROVIDER_SITE_OTHER): Payer: BC Managed Care – PPO | Admitting: Physician Assistant

## 2019-03-21 ENCOUNTER — Encounter: Payer: Self-pay | Admitting: Physician Assistant

## 2019-03-21 VITALS — Ht 62.0 in

## 2019-03-21 DIAGNOSIS — R350 Frequency of micturition: Secondary | ICD-10-CM

## 2019-03-21 DIAGNOSIS — R300 Dysuria: Secondary | ICD-10-CM

## 2019-03-21 DIAGNOSIS — R3989 Other symptoms and signs involving the genitourinary system: Secondary | ICD-10-CM

## 2019-03-21 MED ORDER — NITROFURANTOIN MONOHYD MACRO 100 MG PO CAPS: 100.0000 mg | ORAL_CAPSULE | Freq: Two times a day (BID) | ORAL | 0 refills | Status: DC

## 2019-03-21 NOTE — Progress Notes (Signed)
Patient ID: Stephanie Hogan, female   DOB: 05/08/1981, 38 y.o.   MRN: [id number] .Marland KitchenVirtual Visit via Video Note  I connected with Stephanie Hogan on 03/21/19 at  8:10 AM EDT by a video enabled telemedicine application and verified that I am speaking with the correct person using two identifiers.  Location: Patient: home Provider: clinic   I discussed the limitations of evaluation and management by telemedicine and the availability of in person appointments. The patient expressed understanding and agreed to proceed.  History of Present Illness: Pt is a 38 yo female who is struggling with fertility but having lots of intercourse who presents to the clinic with 3 days of urinary frequency, odor, burning. She had a few UTI last year but none this year. No fever, chills, flank pain, vaginal discharge, nausea, vomiting or body aches. She is having some low back pain and bladder fullness. Azo OTC seems to be helping a lot.   .. Active Ambulatory Problems    Diagnosis Date Noted  . ENTERITIS, DUE TO VIRAL NEC 04/15/2007  . ROM 03/22/2008  . URI 09/05/2008  . ACUTE BRONCHOSPASM 03/15/2008  . SPRAIN/STRAIN, LUMBAR REGION 02/23/2007  . ACUTE BRONCHITIS 09/12/2010  . Grief reaction 11/17/2016  . ETD (Eustachian tube dysfunction), right 06/17/2017  . Acute recurrent maxillary sinusitis 06/17/2017  . Hematoma 10/11/2018   Resolved Ambulatory Problems    Diagnosis Date Noted  . No Resolved Ambulatory Problems   Past Medical History:  Diagnosis Date  . Asthma   . Ovarian cyst    Reviewed med, allergy, problem list.     Observations/Objective: No acute distress.  Normal mood.   .. Today's Vitals   03/21/19 0814  Height: 5\' 2"  (1.575 m)   Body mass index is 30.91 kg/m.    Assessment and Plan: Marland KitchenMarland KitchenDiagnoses and all orders for this visit:  Dysuria -     nitrofurantoin, macrocrystal-monohydrate, (MACROBID) 100 MG capsule; Take 1 capsule (100 mg total) by mouth 2 (two) times daily.  Sensation  of pressure in bladder area -     nitrofurantoin, macrocrystal-monohydrate, (MACROBID) 100 MG capsule; Take 1 capsule (100 mg total) by mouth 2 (two) times daily.  Urinary frequency -     nitrofurantoin, macrocrystal-monohydrate, (MACROBID) 100 MG capsule; Take 1 capsule (100 mg total) by mouth 2 (two) times daily.   Virtual not able to collect UA. Treated with macrobid. Continue azo for next day. Symptomatic care discussed. If spike fever, symptoms worsen, develop flank pain etc call office for in person appt.    Follow Up Instructions:    I discussed the assessment and treatment plan with the patient. The patient was provided an opportunity to ask questions and all were answered. The patient agreed with the plan and demonstrated an understanding of the instructions.   The patient was advised to call back or seek an in-person evaluation if the symptoms worsen or if the condition fails to improve as anticipated.   Iran Planas, PA-C

## 2019-03-21 NOTE — Progress Notes (Signed)
Urine burning, odor. Feels like bladder full. Low back pain. No fever. Started Saturday.

## 2019-04-01 ENCOUNTER — Ambulatory Visit (INDEPENDENT_AMBULATORY_CARE_PROVIDER_SITE_OTHER)
Admission: RE | Admit: 2019-04-01 | Discharge: 2019-04-01 | Disposition: A | Discharge status: Home / Self Care | Payer: BC Managed Care – PPO | Source: Ambulatory Visit

## 2019-04-01 DIAGNOSIS — M545 Low back pain, unspecified: Secondary | ICD-10-CM

## 2019-04-01 DIAGNOSIS — R300 Dysuria: Secondary | ICD-10-CM | POA: Diagnosis not present

## 2019-04-01 DIAGNOSIS — R350 Frequency of micturition: Secondary | ICD-10-CM

## 2019-04-01 MED ORDER — CEPHALEXIN 500 MG PO CAPS: 500.0000 mg | ORAL_CAPSULE | Freq: Four times a day (QID) | ORAL | 0 refills | Status: AC

## 2019-04-01 NOTE — Discharge Instructions (Signed)
Keflex 4 times daily x 7 days  Tylenol for fever/back ache  Follow up in person if symptoms not resolving

## 2019-04-01 NOTE — ED Provider Notes (Signed)
Virtual Visit via Video Note:  Amora Sheehy  initiated request for Telemedicine visit with [facility] team. I connected with Cheral Marker  on 04/02/2019 at 9:05 AM  for a synchronized telemedicine visit using a video enabled HIPPA compliant telemedicine application. I verified that I am speaking with Cheral Marker  using two identifiers. Moria Brophy C Takeisha Cianci, PA-C  was physically located in a [facility] Urgent care site and Noe Pittsley was located at a different location.   The limitations of evaluation and management by telemedicine as well as the availability of in-person appointments were discussed. Patient was informed that she  may incur a bill ( including co-pay) for this virtual visit encounter. Caiden Tourville  expressed understanding and gave verbal consent to proceed with virtual visit.     History of Present Illness:Stephanie Hogan  is a 38 y.o. female presents for evaluation of low back pain and possible UTI.  Patient states that approximately 1 to 2 weeks ago she developed dysuria and increased urinary frequency.  She contacted her primary care which placed her on Macrobid.  She finished the Macrobid.  In the past few days she has noticed increased lower back pain and has noted some intermittent low-grade fevers of 99.2.  She is concerned as in the past she has had UTIs that have led to "kidney infections".  She is concerned that her back pain could be related to this and has not fully resolved.  She has felt slightly nauseous.  She does also note that she is currently going through fertility issues and she had an injection of albuterol last Wednesday.  Last menstrual cycle was 7/6.  She states that she potentially could be pregnant, but is not sure at this time.  Related to her back pain she denies any specific injury or heavy lifting.  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  She denies  tenderness to touching her lower back.  Denies numbness or tingling.  Denies leg weakness.   No Known Allergies   Past Medical History:  Diagnosis Date  . Asthma   . Ovarian cyst     Social History   Tobacco Use  . Smoking status: Never Smoker  . Smokeless tobacco: Never Used  Substance Use Topics  . Alcohol use: Yes    Alcohol/week: 2.0 standard drinks    Types: 2 Standard drinks or equivalent per week    Comment: Occasional  . Drug use: No      Observations/Objective: Physical Exam  Constitutional: She is oriented to person, place, and time and well-developed, well-nourished, and in no distress. No distress.  No acute distress, well-appearing  HENT:  Head: Normocephalic and atraumatic.  Neck: Normal range of motion.  Pulmonary/Chest: Effort normal. No respiratory distress.  Speaking in full sentences  Musculoskeletal: Normal range of motion.     Comments: Moving all extremities appropriately  Neurological: She is alert and oriented to person, place, and time. GCS score is 15.  Speech clear face symmetric     Assessment and Plan:    ICD-10-CM   1. Acute bilateral low back pain without sciatica  M54.5     On chart review of past urine cultures she has grown out Klebsiella which has had intermittent resistance to Macrobid.  Due to this her back pain is slightly concerning for UTI not fully resolving.  Will oblige and  reinitiate antibiotic therapy with Keflex.  Avoiding Cipro and Bactrim given possibility of pregnancy.  Advised to follow-up in person for direct urine sample and culture if symptoms not resolving.  Advised to use Tylenol for back pain for any underlying MSK cause.Discussed strict return precautions. Patient verbalized understanding and is agreeable with plan.    Follow Up Instructions:     I discussed the assessment and treatment plan with the patient. The patient was provided an opportunity to ask questions and all were answered. The patient agreed  with the plan and demonstrated an understanding of the instructions.   The patient was advised to call back or seek an in-person evaluation if the symptoms worsen or if the condition fails to improve as anticipated.      Janith Lima, PA-C  04/02/2019 9:05 AM         Janith Lima, PA-C 04/02/19 [phone]

## 2019-04-14 ENCOUNTER — Encounter: Payer: Self-pay | Admitting: Physician Assistant

## 2019-04-25 ENCOUNTER — Encounter: Payer: Self-pay | Admitting: Physician Assistant

## 2019-05-03 ENCOUNTER — Ambulatory Visit: Payer: BC Managed Care – PPO | Admitting: Physician Assistant

## 2019-05-03 ENCOUNTER — Telehealth: Payer: Self-pay | Admitting: Physician Assistant

## 2019-05-03 ENCOUNTER — Encounter: Payer: Self-pay | Admitting: Physician Assistant

## 2019-05-03 ENCOUNTER — Other Ambulatory Visit: Payer: Self-pay

## 2019-05-03 VITALS — BP 115/83 | HR 99 | Ht 62.0 in | Wt 167.0 lb

## 2019-05-03 DIAGNOSIS — N926 Irregular menstruation, unspecified: Secondary | ICD-10-CM | POA: Diagnosis not present

## 2019-05-03 DIAGNOSIS — H01004 Unspecified blepharitis left upper eyelid: Secondary | ICD-10-CM

## 2019-05-03 DIAGNOSIS — H01001 Unspecified blepharitis right upper eyelid: Secondary | ICD-10-CM | POA: Diagnosis not present

## 2019-05-03 MED ORDER — AZITHROMYCIN 250 MG PO TABS: ORAL_TABLET | ORAL | 0 refills | Status: DC

## 2019-05-03 NOTE — Telephone Encounter (Signed)
error 

## 2019-05-03 NOTE — Patient Instructions (Signed)
Blepharitis Blepharitis is swelling of the eyelids. Symptoms may include:  Reddish, scaly skin around the scalp and eyebrows.  Burning or itching of the eyelids.  Fluid coming from the eye at night. This causes the eyelashes to stick together in the morning.  Eyelashes that fall out.  Being sensitive to light. Follow these instructions at home: Pay attention to any changes in how you look or feel. Tell your health care provider about any changes. Follow these instructions to help with your condition: Keeping clean   Wash your hands often.  Wash your eyelids with warm water, or wash them with warm water that is mixed with little bit of baby shampoo. Do this 2 or more times per day.  Wash your face and eyebrows at least once a day.  Use a clean towel each time you dry your eyelids. Do not use the towel to clean or dry other areas of your body. Do not share your towel with anyone. General instructions  Avoid wearing makeup until you get better. Do not share makeup with anyone.  Avoid rubbing your eyes.  Put a warm compress on your eyes 2 times per day for 10 minutes at a time, or as told by your doctor.  If you were given antibiotics in the form of creams or eye drops, use the medicine as told by your doctor. Do not stop using the medicine even if you feel better.  Keep all follow-up visits as told by your doctor. This is important. Contact a doctor if:  Your eyelids feel hot.  You have blisters on your eyelids.  You have a rash on your eyelids.  The swelling does not go away in 2-4 days.  The swelling gets worse. Get help right away if:  You have pain that gets worse.  You have pain that spreads to other parts of your face.  You have redness that gets worse.  You have redness that spreads to other parts of your face.  Your vision changes.  You have pain when you look at lights or things that move.  You have a fever. Summary  Blepharitis is swelling of the  eyelids.  Pay attention to any changes in how your eyes look or feel. Tell your doctor about any changes.  Follow home care instructions as told by your doctor. Wash your hands often. Avoid wearing makeup. Do not rub your eyes.  Use warm compresses, creams, or eye drops as told by your doctor.  Let your doctor know if you have changes in vision, blisters or rash on eyelids, pain that spreads to your face, or warmth on your eyelids. This information is not intended to replace advice given to you by your health care provider. Make sure you discuss any questions you have with your health care provider. Document Released: 06/02/2008 Document Revised: 02/21/2018 Document Reviewed: 02/21/2018 Elsevier Patient Education  2020 [employer].

## 2019-05-05 ENCOUNTER — Encounter: Payer: Self-pay | Admitting: Physician Assistant

## 2019-05-05 NOTE — Progress Notes (Signed)
   Subjective:    Patient ID: Stephanie Hogan, female    DOB: May 23, 1981, 38 y.o.   MRN: [id number]  HPI Pt is a 38 yo female who presents to the clinic with bilateral upper eyelid pain and swelling. It started with right but now left as well. She has used OTC ointment but has not made any difference. No new makeup or eye wash. She admits her eyeliner frequently irritates her eyes. No fever, chills, body aches. Her vision is a little blurry.   Pt is actively trying to get pregnant with fertility specialist help. She is one day late for her period and thought she saw line on home pregnancy test. She would like blood work.   .. Active Ambulatory Problems    Diagnosis Date Noted  . ENTERITIS, DUE TO VIRAL NEC 04/15/2007  . ROM 03/22/2008  . URI 09/05/2008  . ACUTE BRONCHOSPASM 03/15/2008  . SPRAIN/STRAIN, LUMBAR REGION 02/23/2007  . ACUTE BRONCHITIS 09/12/2010  . Grief reaction 11/17/2016  . ETD (Eustachian tube dysfunction), right 06/17/2017  . Acute recurrent maxillary sinusitis 06/17/2017  . Hematoma 10/11/2018   Resolved Ambulatory Problems    Diagnosis Date Noted  . No Resolved Ambulatory Problems   Past Medical History:  Diagnosis Date  . Asthma   . Ovarian cyst     Review of Systems  All other systems reviewed and are negative.      Objective:   Physical Exam Vitals signs reviewed.  Constitutional:      Appearance: Normal appearance.  HENT:     Head: Normocephalic.     Right Ear: Tympanic membrane normal.     Left Ear: Tympanic membrane normal.     Nose: Nose normal.     Mouth/Throat:     Mouth: Mucous membranes are moist.     Pharynx: Oropharynx is clear.  Eyes:     Extraocular Movements: Extraocular movements intact.     Conjunctiva/sclera: Conjunctivae normal.     Pupils: Pupils are equal, round, and reactive to light.     Comments: Bilateral upper eyelid swollen and red with some fine scales around eyelashes. Right worse than left. No injected conjunctiva and  no active discharge.   Cardiovascular:     Rate and Rhythm: Normal rate and regular rhythm.     Pulses: Normal pulses.  Pulmonary:     Effort: Pulmonary effort is normal.  Neurological:     General: No focal deficit present.     Mental Status: She is alert.           Assessment & Plan:  Marland KitchenMarland KitchenFrance was seen today for eye problem.  Diagnoses and all orders for this visit:  Blepharitis of upper eyelids of both eyes, unspecified type -     azithromycin (ZITHROMAX) 250 MG tablet; Take 2 tablets now and then one tablet for 4 days.  Missed period -     B-HCG Quant   Right eye vision off just a hair. Looks like blepharitis. Get basch and laumb eye wash to use. Stop using eye liner for a few weeks and then get a new one. Erythema getting worse concerning for some cellulitis and pretty swollen given oral azithromycin. Warm compresses. HO given.   Will check blood HCG.

## 2019-05-30 ENCOUNTER — Encounter: Payer: Self-pay | Admitting: Physician Assistant

## 2019-05-31 ENCOUNTER — Encounter: Payer: Self-pay | Admitting: Physician Assistant

## 2019-06-22 ENCOUNTER — Encounter: Payer: Self-pay | Admitting: Physician Assistant

## 2019-06-23 ENCOUNTER — Encounter: Payer: Self-pay | Admitting: Physician Assistant

## 2019-07-23 ENCOUNTER — Encounter: Payer: Self-pay | Admitting: Physician Assistant

## 2019-07-24 MED ORDER — ERYTHROMYCIN 5 MG/GM OP OINT: TOPICAL_OINTMENT | OPHTHALMIC | 0 refills | Status: DC

## 2019-08-21 ENCOUNTER — Encounter: Payer: Self-pay | Admitting: Physician Assistant

## 2019-10-09 ENCOUNTER — Encounter: Payer: Self-pay | Admitting: Physician Assistant

## 2019-10-09 ENCOUNTER — Telehealth: Payer: BC Managed Care – PPO

## 2019-10-09 ENCOUNTER — Telehealth (INDEPENDENT_AMBULATORY_CARE_PROVIDER_SITE_OTHER): Payer: BC Managed Care – PPO | Admitting: Physician Assistant

## 2019-10-09 DIAGNOSIS — J0100 Acute maxillary sinusitis, unspecified: Secondary | ICD-10-CM | POA: Diagnosis not present

## 2019-10-09 MED ORDER — AZITHROMYCIN 250 MG PO TABS: ORAL_TABLET | ORAL | 0 refills | Status: DC

## 2019-10-09 NOTE — Progress Notes (Signed)
Patient ID: Stephanie Hogan, female   DOB: [birth year], 39 y.o.   MRN: [id number] .Marland KitchenVirtual Visit via Video Note  I connected with Landri Stello on 10/09/19 at  4:20 PM EST by a video enabled telemedicine application and verified that I am speaking with the correct person using two identifiers.  Location: Patient: home Provider: clinic   I discussed the limitations of evaluation and management by telemedicine and the availability of in person appointments. The patient expressed understanding and agreed to proceed.  History of Present Illness: Pt is a 39 yo female who is [redacted] weeks pregnant and had 2 weeks of sinus pressure, headache, runny nose, bloody nose, teeth pain, congestion. She denies any fever, chills, body aches, loss of smell or taste. She thought she was getting better and then she woke up even more congested this morning. This morning she blew out some blood with blowing nose. She is taking benadryl and vitamin C.   .. Active Ambulatory Problems    Diagnosis Date Noted  . ENTERITIS, DUE TO VIRAL NEC 04/15/2007  . ROM 03/22/2008  . URI 09/05/2008  . ACUTE BRONCHOSPASM 03/15/2008  . SPRAIN/STRAIN, LUMBAR REGION 02/23/2007  . ACUTE BRONCHITIS 09/12/2010  . Grief reaction 11/17/2016  . ETD (Eustachian tube dysfunction), right 06/17/2017  . Acute recurrent maxillary sinusitis 06/17/2017  . Hematoma 10/11/2018   Resolved Ambulatory Problems    Diagnosis Date Noted  . No Resolved Ambulatory Problems   Past Medical History:  Diagnosis Date  . Asthma   . Ovarian cyst    Reviewed med, allergy, problem list.     Observations/Objective: No acute distress.  Normal mood and appearance.  No cough.  Flushed cheeks.   Marland Kitchen.There were no vitals filed for this visit. There is no height or weight on file to calculate BMI.     Assessment and Plan: Marland KitchenMarland KitchenSadielynn was seen today for sore throat.  Diagnoses and all orders for this visit:  Acute non-recurrent maxillary sinusitis -      azithromycin (ZITHROMAX) 250 MG tablet; Take 2 tablets now and then one tablet for 4 days.   2 weeks of symptoms. Treated with zpak. Consider ocean spray nasal spray for moisture. Stay hydrated. Stop benadryl. Continue vitamin C. Use humidifer in room.    Follow Up Instructions:    I discussed the assessment and treatment plan with the patient. The patient was provided an opportunity to ask questions and all were answered. The patient agreed with the plan and demonstrated an understanding of the instructions.   The patient was advised to call back or seek an in-person evaluation if the symptoms worsen or if the condition fails to improve as anticipated.    Stephanie Planas, PA-C

## 2019-11-09 IMAGING — US US OB COMP LESS 14 WK
2 series · 14 of 28 positions shown · non-contrast
Comparison: None.

CLINICAL DATA: Evaluate for fetal heart tones.

EXAM:
OBSTETRIC <14 WK US AND TRANSVAGINAL OB US
TECHNIQUE: Both transabdominal and transvaginal ultrasound examinations were
performed for complete evaluation of the gestation as well as the
maternal uterus, adnexal regions, and pelvic cul-de-sac.
Transvaginal technique was performed to assess early pregnancy.

[Series 1: us ob comp less 14 wk · 0.15mm/px · 13 of 30 slices shown (1 of 2)]
[im 2/30]
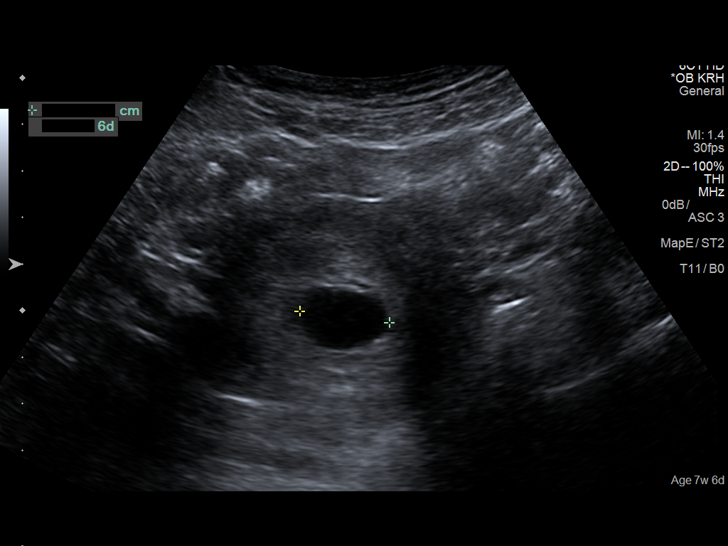
[im 4/30]
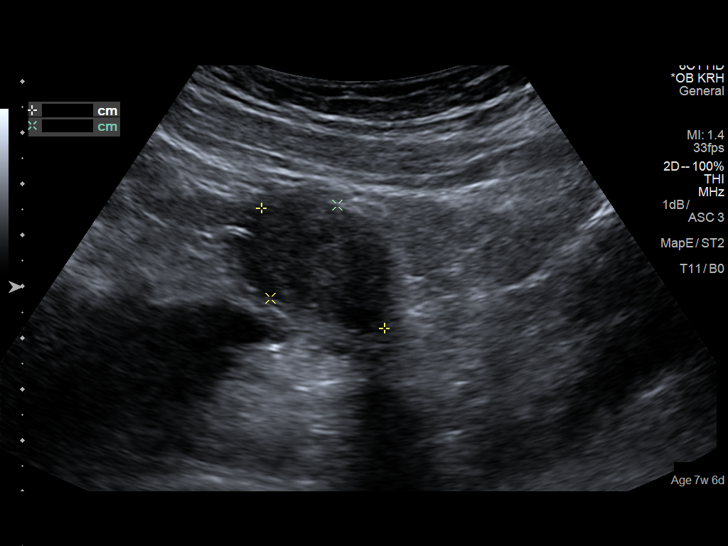
[im 6/30]
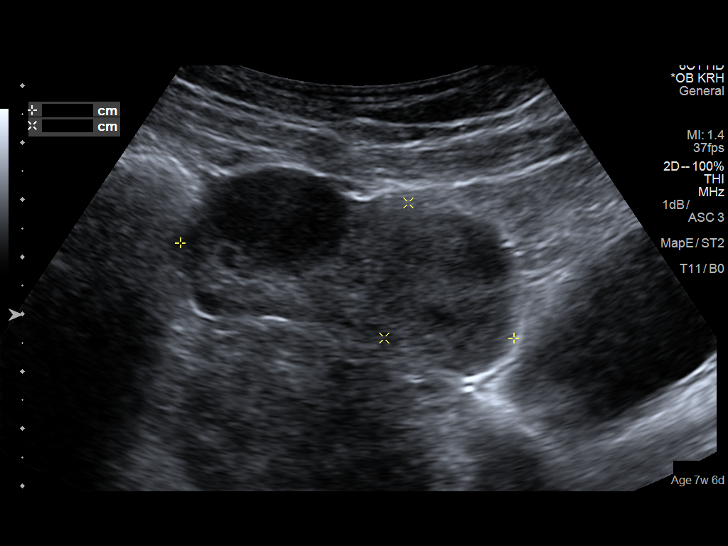
[im 8/30]
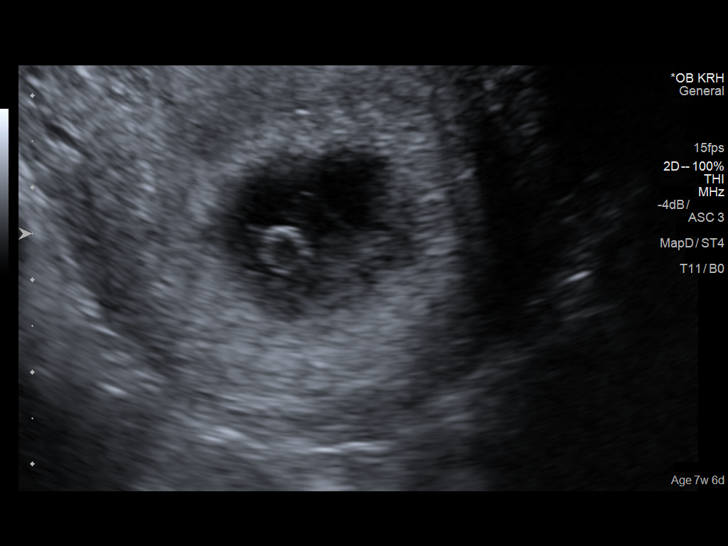
[im 11/30]
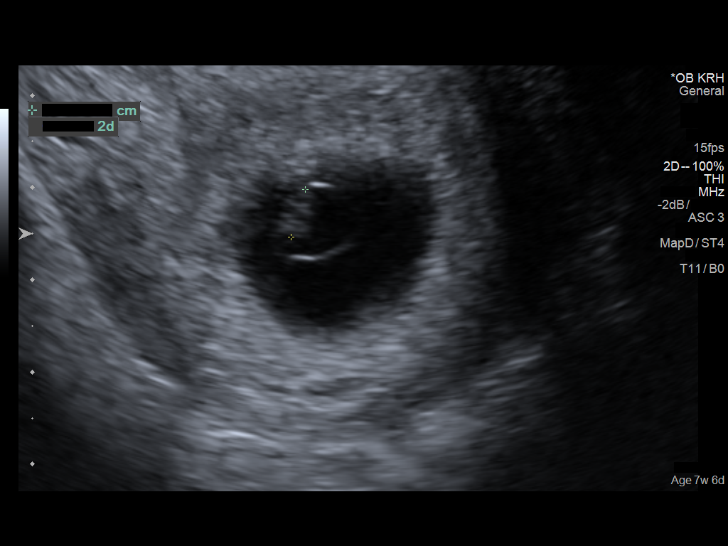
[im 13/30]
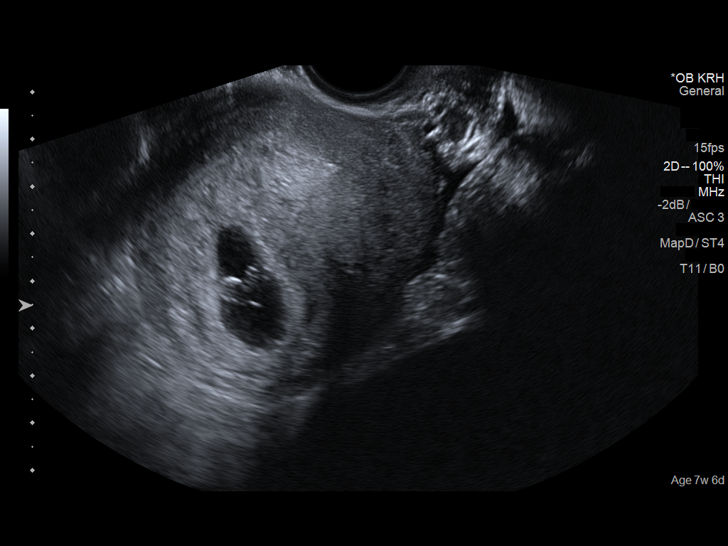
[im 15/30]
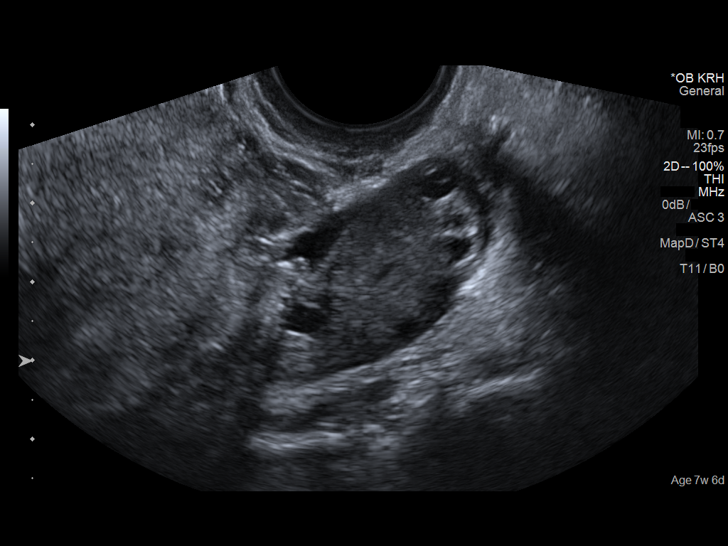
[im 17/30]
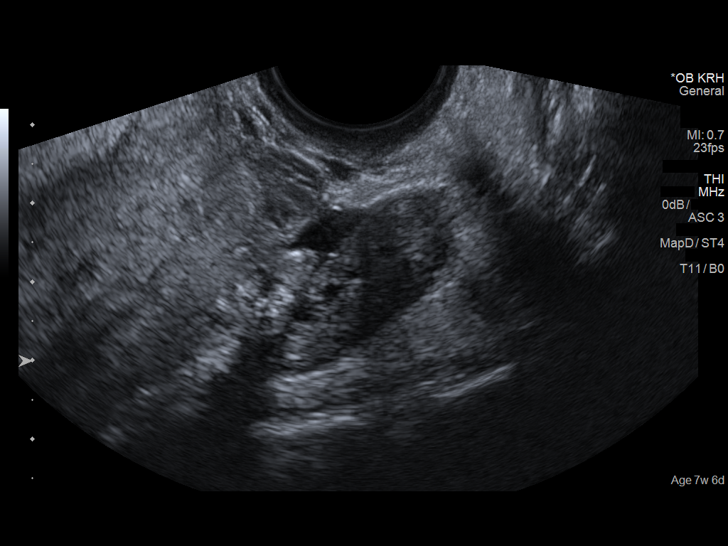
[im 19/30]
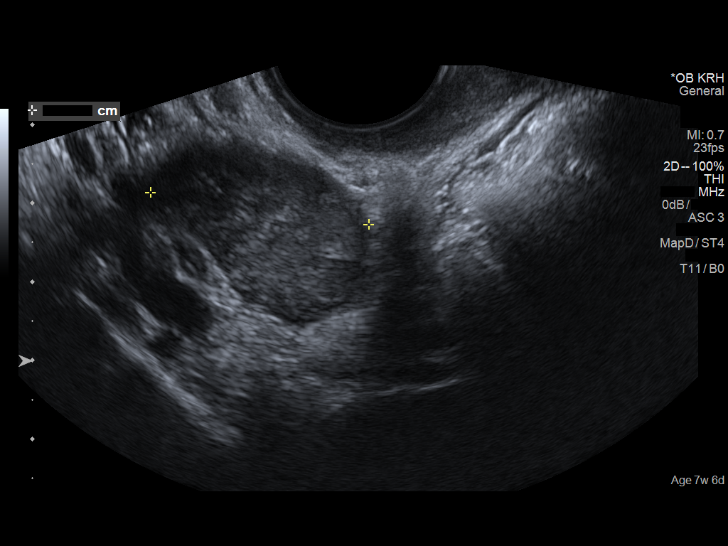
[im 22/30]
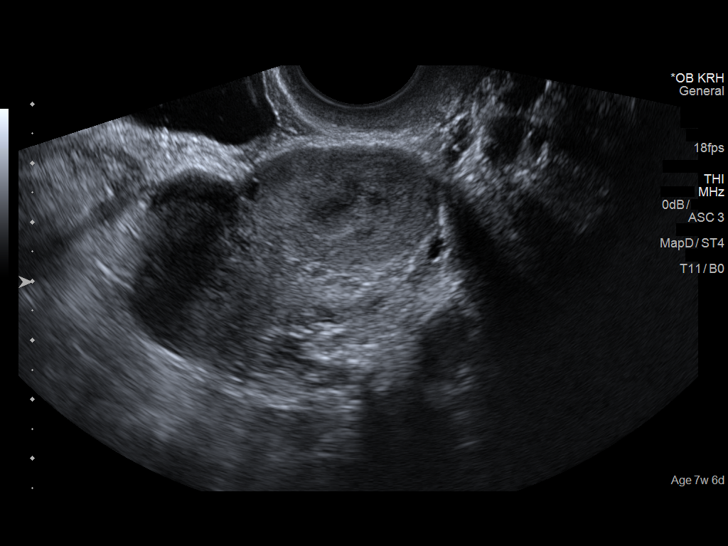
[im 24/30]
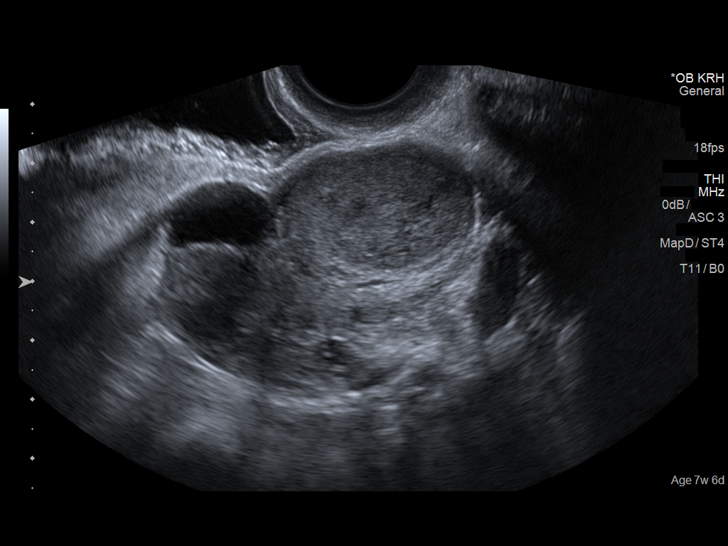
[im 26/30]
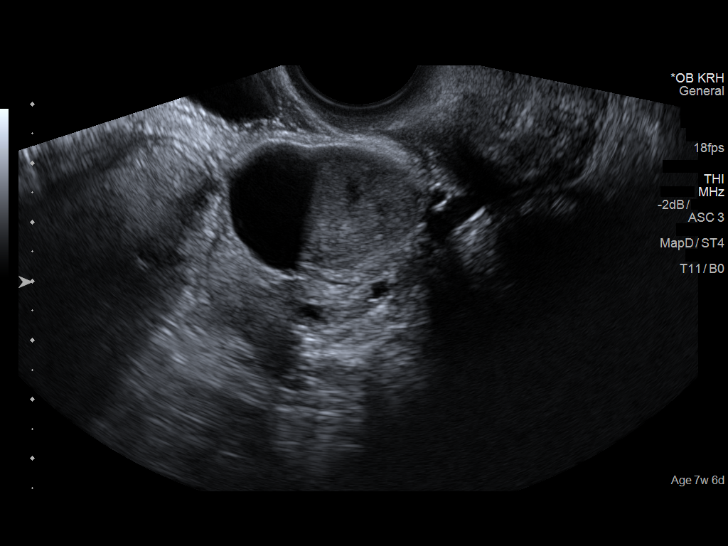
[im 28/30]
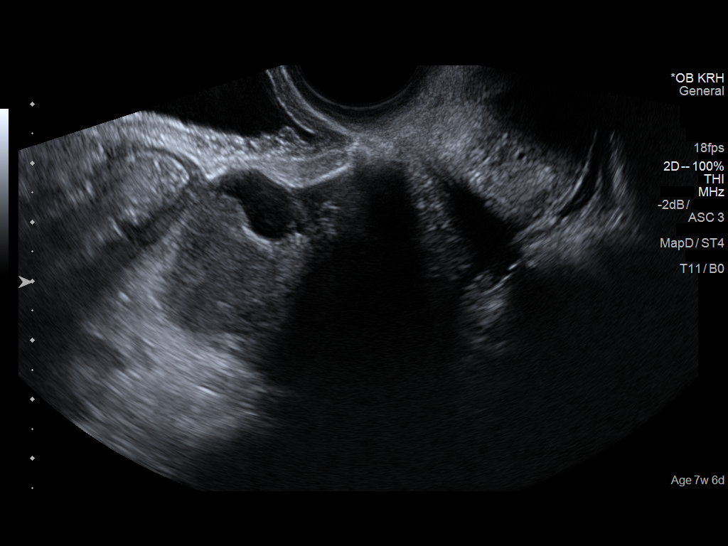

[Series 1002: us ob comp less 14 wk · 1 of 1 slices shown (2 of 2)]
[im 1/1]
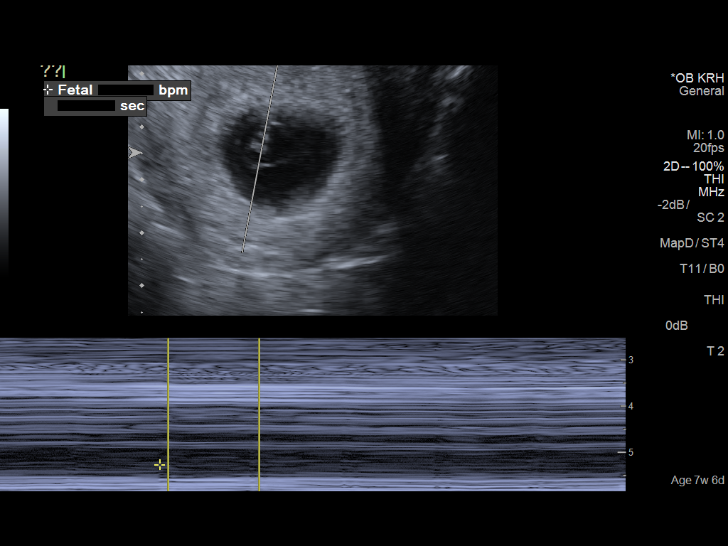

[14 of 28 positions shown; findings below may reference images not displayed]

FINDINGS: Intrauterine gestational sac: Single

Yolk sac:  Visualized.

Embryo:  Visualized.

Cardiac Activity: Visualized.

Heart Rate: 113 bpm

CRL:  5.4 mm   6 w   2 d                  US EDC: 11/12/2018

Subchorionic hemorrhage:  None visualized.

Maternal uterus/adnexae: Normal right ovary. Corpus luteum left
ovary. Small amount of free fluid in the pelvis.
IMPRESSION: Single live intrauterine gestation.  No subchorionic hemorrhage.

## 2020-03-26 ENCOUNTER — Encounter: Payer: Self-pay | Admitting: Physician Assistant

## 2020-04-07 ENCOUNTER — Emergency Department: Admit: 2020-04-07 | Discharge status: Absent without leave | Payer: Self-pay

## 2020-04-13 ENCOUNTER — Other Ambulatory Visit: Payer: Self-pay

## 2020-04-13 ENCOUNTER — Encounter: Payer: Self-pay | Admitting: Physician Assistant

## 2020-04-13 ENCOUNTER — Emergency Department (INDEPENDENT_AMBULATORY_CARE_PROVIDER_SITE_OTHER)
Admission: EM | Admit: 2020-04-13 | Discharge: 2020-04-13 | Disposition: A | Discharge status: Home / Self Care | Payer: BC Managed Care – PPO | Source: Home / Self Care

## 2020-04-13 ENCOUNTER — Encounter: Payer: Self-pay | Admitting: Emergency Medicine

## 2020-04-13 DIAGNOSIS — R0981 Nasal congestion: Secondary | ICD-10-CM

## 2020-04-13 DIAGNOSIS — R519 Headache, unspecified: Secondary | ICD-10-CM

## 2020-04-13 MED ORDER — IPRATROPIUM BROMIDE 0.06 % NA SOLN: 2.0000 | Freq: Four times a day (QID) | NASAL | 1 refills | Status: DC

## 2020-04-13 NOTE — ED Triage Notes (Signed)
Patient c/o possible sinus infection, facial pain and pressure x 1 day, congestion and teeth hurt, afebrile.

## 2020-04-13 NOTE — Discharge Instructions (Signed)
  You may take 500mg  acetaminophen every 4-6 hours or in combination with ibuprofen 400-600mg  every 6-8 hours as needed for pain, inflammation, and fever.  Be sure to well hydrated with clear liquids and get at least 8 hours of sleep at night, preferably more while sick.   Please follow up with family medicine in 1 week if not improving, sooner if symptoms worsening. You may want to consider Covid-19 testing due recent increase in cases in the area, especially if you develop worsening symptoms or others around you test positive.

## 2020-04-13 NOTE — ED Provider Notes (Signed)
[name]    CSN: [id number] Arrival date & time: 04/13/20  1108      History   Chief Complaint Chief Complaint  Patient presents with  . Facial Pain    HPI Stephanie Hogan is a 39 y.o. female.   HPI  Stephanie Hogan is a 39 y.o. female presenting to UC with c/o sinus congestion and pressure that started last night.  Pain is aching, 4/10. No medication or home treatment tried for symptoms.  Hx of sinus infections. Denies fever, chills, n/v/d. Pt is 21mo post-partum and wants to make sure she is not contagious. She has not received the Covid-19 vaccine.  Pt does not think she has Covid, she did have it last year.  Denies sick contacts or recent travel.    Past Medical History:  Diagnosis Date  . Asthma   . Ovarian cyst     Patient Active Problem List   Diagnosis Date Noted  . Hematoma 10/11/2018  . ETD (Eustachian tube dysfunction), right 06/17/2017  . Acute recurrent maxillary sinusitis 06/17/2017  . Grief reaction 11/17/2016  . ACUTE BRONCHITIS 09/12/2010  . URI 09/05/2008  . ROM 03/22/2008  . ACUTE BRONCHOSPASM 03/15/2008  . ENTERITIS, DUE TO VIRAL NEC 04/15/2007  . SPRAIN/STRAIN, LUMBAR REGION 02/23/2007    Past Surgical History:  Procedure Laterality Date  . CHOLECYSTECTOMY    . KNEE SURGERY    . MOLE REMOVAL      OB History    Gravida  1   Para  0   Term  0   Preterm  0   AB  0   Living  0     SAB  0   TAB  0   Ectopic  0   Multiple  0   Live Births               Home Medications    Prior to Admission medications   Medication Sig Start Date End Date Taking? Authorizing Provider  Cephalexin 500 MG tablet SMARTSIG:1 Tablet(s) By Mouth Every 12 Hours 04/09/20  Yes [provider]  azithromycin (ZITHROMAX) 250 MG tablet Take 2 tablets now and then one tablet for 4 days. 10/09/19   Breeback, Jade L, PA-C  ipratropium (ATROVENT) 0.06 % nasal spray Place 2 sprays into both nostrils 4 (four) times daily. 04/13/20   Noe Gens, PA-C  letrozole [facility]) 2.5 MG tablet  03/14/19   [provider]  metFORMIN (GLUCOPHAGE) 1000 MG tablet Take 1,000 mg by mouth 2 (two) times a day. 02/14/19   [provider]  Prenatal Multivit-Min-Fe-FA (PRENATAL VITAMINS PO) Take by mouth.    [provider]    Family History Family History  Problem Relation Age of Onset  . Lung cancer Maternal Grandfather   . Breast cancer Maternal Grandmother   . Alzheimer's disease Paternal Grandfather     Social History Social History   Tobacco Use  . Smoking status: Never Smoker  . Smokeless tobacco: Never Used  Vaping Use  . Vaping Use: Never used  Substance Use Topics  . Alcohol use: Yes    Alcohol/week: 2.0 standard drinks    Types: 2 Standard drinks or equivalent per week    Comment: Occasional  . Drug use: No     Allergies   Patient has no known allergies.   Review of Systems Review of Systems  Constitutional: Negative for chills and fever.  HENT: Positive for congestion and sinus pressure. Negative for ear  pain, sinus pain, sore throat, trouble swallowing and voice change.   Respiratory: Negative for cough and shortness of breath.   Cardiovascular: Negative for chest pain and palpitations.  Gastrointestinal: Negative for abdominal pain, diarrhea, nausea and vomiting.  Musculoskeletal: Negative for arthralgias, back pain and myalgias.  Skin: Negative for rash.  Neurological: Positive for headaches. Negative for dizziness and light-headedness.  All other systems reviewed and are negative.    Physical Exam Triage Vital Signs ED Triage Vitals  Enc Vitals Group     BP 04/13/20 1315 (!) 136/91     Pulse Rate 04/13/20 1315 97     Resp --      Temp 04/13/20 1315 98.9 F (37.2 C)     Temp Source 04/13/20 1315 Oral     SpO2 04/13/20 1315 96 %     Weight --      Height --      Head Circumference --      Peak Flow --      Pain Score 04/13/20 1316 4     Pain Loc --      Pain Edu? --       Excl. in [place]? --    No data found.  Updated Vital Signs BP (!) 136/91 (BP Location: Left Arm)   Pulse 97   Temp 98.9 F (37.2 C) (Oral)   SpO2 96%   Breastfeeding No   Visual Acuity Right Eye Distance:   Left Eye Distance:   Bilateral Distance:    Right Eye Near:   Left Eye Near:    Bilateral Near:     Physical Exam Vitals and nursing note reviewed.  Constitutional:      General: She is not in acute distress.    Appearance: Normal appearance. She is well-developed. She is not ill-appearing, toxic-appearing or diaphoretic.  HENT:     Head: Normocephalic and atraumatic.     Right Ear: Tympanic membrane and ear canal normal.     Left Ear: Tympanic membrane and ear canal normal.     Nose: Nose normal.     Right Sinus: No maxillary sinus tenderness or frontal sinus tenderness.     Left Sinus: No maxillary sinus tenderness or frontal sinus tenderness.     Mouth/Throat:     Lips: Pink.     Mouth: Mucous membranes are moist.     Pharynx: Oropharynx is clear. Uvula midline.  Cardiovascular:     Rate and Rhythm: Normal rate and regular rhythm.  Pulmonary:     Effort: Pulmonary effort is normal. No respiratory distress.     Breath sounds: Normal breath sounds. No stridor. No wheezing, rhonchi or rales.  Musculoskeletal:        General: Normal range of motion.     Cervical back: Normal range of motion.  Skin:    General: Skin is warm and dry.  Neurological:     Mental Status: She is alert and oriented to person, place, and time.  Psychiatric:        Behavior: Behavior normal.      UC Treatments / Results  Labs (all labs ordered are listed, but only abnormal results are displayed) Labs Reviewed - No data to display  EKG   Radiology No results found.  Procedures Procedures (including critical care time)  Medications Ordered in UC Medications - No data to display  Initial Impression / Assessment and Plan / UC Course  I have reviewed the triage vital signs and  the nursing notes.  Pertinent labs & imaging results that were available during my care of the patient were reviewed by me and considered in my medical decision making (see chart for details).    Due to short duration of symptoms, lack of fever, bacterial infection unlikely Pt declined Covid-19 testing Encouraged symptomatic tx, f/u PCP  AVS given  Final Clinical Impressions(s) / UC Diagnoses   Final diagnoses:  Nasal congestion  Sinus headache     Discharge Instructions      You may take 500mg  acetaminophen every 4-6 hours or in combination with ibuprofen 400-600mg  every 6-8 hours as needed for pain, inflammation, and fever.  Be sure to well hydrated with clear liquids and get at least 8 hours of sleep at night, preferably more while sick.   Please follow up with family medicine in 1 week if not improving, sooner if symptoms worsening. You may want to consider Covid-19 testing due recent increase in cases in the area, especially if you develop worsening symptoms or others around you test positive.      ED Prescriptions    Medication Sig Dispense Auth. Provider   ipratropium (ATROVENT) 0.06 % nasal spray Place 2 sprays into both nostrils 4 (four) times daily. 15 mL Noe Gens, PA-C     PDMP not reviewed this encounter.   Noe Gens, [place] 04/14/20 [phone]

## 2020-04-15 ENCOUNTER — Encounter: Payer: Self-pay | Admitting: Nurse Practitioner

## 2020-04-15 ENCOUNTER — Telehealth (INDEPENDENT_AMBULATORY_CARE_PROVIDER_SITE_OTHER): Payer: BC Managed Care – PPO | Admitting: Nurse Practitioner

## 2020-04-15 DIAGNOSIS — J0100 Acute maxillary sinusitis, unspecified: Secondary | ICD-10-CM

## 2020-04-15 MED ORDER — AZITHROMYCIN 250 MG PO TABS: ORAL_TABLET | ORAL | 0 refills | Status: DC

## 2020-04-15 NOTE — Progress Notes (Signed)
Virtual Video Visit via MyChart Note  I connected with  Stephanie Hogan on 04/15/20 at  2:30 PM EDT by the video enabled telemedicine application for , MyChart, and verified that I am speaking with the correct person using two identifiers.   I introduced myself as a Designer, jewellery with the practice. We discussed the limitations of evaluation and management by telemedicine and the availability of in person appointments. The patient expressed understanding and agreed to proceed.  The patient is: at home I am: in the office  Subjective:    CC:  Chief Complaint  Patient presents with  . Sinusitis    onset:3d, HA, facial pain, ST, PND, cough, green mucus, sinus congestion, eye pressure, teeth are sore, has been using nasal spray and netti pot with minimal relief    HPI: Stephanie Hogan is a 39 y.o. y/o female presenting via [place] today for sinus infection. She reports that she first started having symptoms last week of cough, sore throat, headache, sinus pain and pressure, tooth pain, and increased mucous production. She was seen in the urgent care on Saturday and was given a prescription for nasal spray. She reports that her sinus congestion has improved with the nasal spray, but her other symptoms are worsening. She is concerned because she has a newborn at home and is returning to work as a Pharmacist, hospital next week. She has also been using a netti pot, which has been helpful with her sinus drainage, but not pain and pressure.   She denies fever, shortness of breath, loss of taste or smell, chills, or weakness.   Past medical history, Surgical history, Family history not pertinant except as noted below, Social history, Allergies, and medications have been entered into the medical record, reviewed, and corrections made.   Review of Systems:  See HPI for pertinent positive and negatives  Objective:    General: Speaking clearly in complete sentences without any shortness of breath.   Alert and oriented  x3.   Normal judgment.  No apparent acute distress.  Impression and Recommendations:   1. Acute non-recurrent maxillary sinusitis Symptoms and presentation consistent with acute maxillary sinusitis. She responded well to azithromycin in February of this year for the same symptoms. Continue netti pot and nasal spray provided by urgent care. May take tylenol or ibuprofen for pain.  Follow-up if symptoms worsen or fail to improve.  - azithromycin (ZITHROMAX) 250 MG tablet; Take 2 tablets now and then one tablet for 4 days.  Dispense: 6 tablet; Refill: 0    I discussed the assessment and treatment plan with the patient. The patient was provided an opportunity to ask questions and all were answered. The patient agreed with the plan and demonstrated an understanding of the instructions.   The patient was advised to call back or seek an in-person evaluation if the symptoms worsen or if the condition fails to improve as anticipated.  I provided 20 minutes of non-face-to-face interaction with this [place] visit including intake, same-day documentation, and chart review.   Orma Render, NP

## 2020-06-21 ENCOUNTER — Encounter: Payer: Self-pay | Admitting: Nurse Practitioner

## 2020-06-21 ENCOUNTER — Telehealth (INDEPENDENT_AMBULATORY_CARE_PROVIDER_SITE_OTHER): Payer: BC Managed Care – PPO | Admitting: Nurse Practitioner

## 2020-06-21 ENCOUNTER — Encounter: Payer: Self-pay | Admitting: Physician Assistant

## 2020-06-21 DIAGNOSIS — H01001 Unspecified blepharitis right upper eyelid: Secondary | ICD-10-CM | POA: Diagnosis not present

## 2020-06-21 MED ORDER — AZITHROMYCIN 250 MG PO TABS: ORAL_TABLET | ORAL | 0 refills | Status: DC

## 2020-06-21 NOTE — Progress Notes (Signed)
Virtual Video Visit via MyChart Note--- CHANGED TO  I connected with  Stephanie Hogan on 06/21/20 at  4:10 PM EDT by the video enabled telemedicine application for , MyChart, and verified that I am speaking with the correct person using two identifiers.   I introduced myself as a Designer, jewellery with the practice. We discussed the limitations of evaluation and management by telemedicine and the availability of in person appointments. The patient expressed understanding and agreed to proceed.  The patient is: at home I am: in the office  Subjective:    CC:  Chief Complaint  Patient presents with  . Belepharitis    HPI: Stephanie Hogan is a 39 y.o. y/o female presenting via [place] today for swelling in her right eyelid with irritation, itching, and watery drainage for the past two days. She denies purulent drainage, fever, chills, visual changes, or upper respiratory symptoms. She denies injected conjunctiva.   She reports that she had similar symptoms earlier this year and was given an antibiotic for treatment which cleared the infection up quickly. She is requesting this treatment today.   Past medical history, Surgical history, Family history not pertinant except as noted below, Social history, Allergies, and medications have been entered into the medical record, reviewed, and corrections made.   Review of Systems:  See HPI for pertinent positive and negatives  Objective:    General: Speaking clearly in complete sentences without any shortness of breath.   Alert and oriented x3.   Normal judgment.  No apparent acute distress.   Impression and Recommendations:    1. Blepharitis of right upper eyelid, unspecified type Symptoms and presentation consistent with blepharitis of the eyelid. Recommend washing the eyelids twice a day with tear free baby shampoo.  Will go ahead and treat today with azithromycin- patient reports she has difficulty using eye drops, therefore we will go with oral  formulation.  Encouraged the patient to avoid eye makeup for at least a week and to watch the area carefully for signs of irritation, redness, purulent drainage, or changes to vision.  Follow-up if symptoms worsen or fail to improve.     I discussed the assessment and treatment plan with the patient. The patient was provided an opportunity to ask questions and all were answered. The patient agreed with the plan and demonstrated an understanding of the instructions.   The patient was advised to call back or seek an in-person evaluation if the symptoms worsen or if the condition fails to improve as anticipated.  I provided 15 minutes of non-face-to-face interaction with this [place] visit including intake, same-day documentation, and chart review.   Orma Render, NP

## 2020-06-21 NOTE — Addendum Note (Signed)
Addended by: Haneen Bernales, Clarise Cruz E on: 06/21/2020 04:38 PM   Modules accepted: Orders

## 2020-06-21 NOTE — Patient Instructions (Signed)
Wash the eyes twice a day with tear free unscented baby shampoo and pat dry. You can use warm washcloth compresses 3-4 times a day for the next several days to help with inflammation.  Avoid eye makeup for at least the next 7 days. If your symptoms do not improve or if you begin to develop eye pain, redness, mucus-like drainage, fever, chills, or sinus symptoms please let us know. If your vision begins to change or you have sensitivity to light, please seek care immediately.

## 2020-07-08 ENCOUNTER — Other Ambulatory Visit: Payer: Self-pay

## 2020-07-08 ENCOUNTER — Ambulatory Visit (INDEPENDENT_AMBULATORY_CARE_PROVIDER_SITE_OTHER): Payer: BC Managed Care – PPO | Admitting: Physician Assistant

## 2020-07-08 ENCOUNTER — Encounter: Payer: Self-pay | Admitting: Physician Assistant

## 2020-07-08 VITALS — BP 120/72 | HR 77 | Temp 98.7°F | Wt 183.1 lb

## 2020-07-08 DIAGNOSIS — H0102A Squamous blepharitis right eye, upper and lower eyelids: Secondary | ICD-10-CM

## 2020-07-08 MED ORDER — ERYTHROMYCIN 5 MG/GM OP OINT: 1.0000 "application " | TOPICAL_OINTMENT | Freq: Three times a day (TID) | OPHTHALMIC | 0 refills | Status: AC

## 2020-07-08 NOTE — Patient Instructions (Signed)
Blepharitis Blepharitis is inflammation of the eyelids. Blepharitis may happen with:  Reddish, scaly skin around the scalp and eyebrows.  Burning or itching of the eyelids.  Eye discharge at night that causes the eyelashes to stick together in the morning.  Eyelashes that fall out.  Sensitivity to light. Follow these instructions at home: Pay attention to any changes in how your eyes look or feel. Tell your health care provider about any changes. Follow these instructions to help with your condition. Keeping Coca[employer] your hands often.  Wash your eyelids with warm water or with warm water that is mixed with a small amount of baby shampoo. Do this two times per day or as often as needed.  Wash your face and eyebrows at least once a day.  Use a clean towel each time you dry your eyelids. Do not use this towel to clean or dry other areas of your body. Do not share your towel with anyone. General instructions  Avoid wearing makeup until you get better. Do not share makeup with anyone.  Avoid rubbing your eyes.  Apply warm compresses to your eyes 2 times per day for 10 minutes at a time, or as told by your health care provider.  If you were prescribed an antibiotic ointment or steroid drops, apply or use the medicine as told by your health care provider. Do not stop using the medicine even if you feel better.  Keep all follow-up visits as told by your health care provider. This is important. Contact a health care provider if:  Your eyelids feel hot.  You have blisters or a rash on your eyelids.  The condition does not go away in 2-4 days.  The inflammation gets worse. Get help right away if:  You have pain or redness that gets worse or spreads to other parts of your face.  Your vision changes.  You have pain when looking at lights or moving objects.  You have a fever. Summary  Blepharitis is inflammation of the eyelids.  Pay attention to any changes in how your  eyes look or feel. Tell your health care provider about any changes.  Follow home care instructions as told by your health care provider. Wash your hands often. Avoid wearing makeup. Do not rub your eyes.  To treat this condition, use warm compresses and prescription ointments or eye drops.  Let your health care provider know if you have vision changes, blisters or rash on eyelids, pain that spreads to your face, or warmth on your eyelids. This information is not intended to replace advice given to you by your health care provider. Make sure you discuss any questions you have with your health care provider. Document Revised: 02/14/2018 Document Reviewed: 02/14/2018 Elsevier Patient Education  [employer].

## 2020-07-08 NOTE — Progress Notes (Signed)
   Subjective:    Patient ID: Stephanie Hogan, female    DOB: [birth year], 39 y.o.   MRN: [id number]  HPI  Patient is a 39 year old female with history of blepharitis who presents to the clinic with red, swollen, tender right upper and lower eyelid.  She has had this ongoing for about the last month.  She has been treated with 2 refills of her biotics.  It does seem to get a little better and then comes right back.  She is not wearing make-up and washing her eyelids with baby soap.  Eyelids are tender and itchy but not overtly painful.  She denies any fever, chills, nausea.  She denies any vision changes.  She denies any eye discharge.  Last antibiotic was given on 10/15 for azithromycin.   .. Active Ambulatory Problems    Diagnosis Date Noted  . ENTERITIS, DUE TO VIRAL NEC 04/15/2007  . ROM 03/22/2008  . URI 09/05/2008  . ACUTE BRONCHOSPASM 03/15/2008  . SPRAIN/STRAIN, LUMBAR REGION 02/23/2007  . ACUTE BRONCHITIS 09/12/2010  . Grief reaction 11/17/2016  . ETD (Eustachian tube dysfunction), right 06/17/2017  . Acute recurrent maxillary sinusitis 06/17/2017  . Hematoma 10/11/2018  . Squamous blepharitis of both upper and lower eyelid of right eye 07/09/2020   Resolved Ambulatory Problems    Diagnosis Date Noted  . No Resolved Ambulatory Problems   Past Medical History:  Diagnosis Date  . Asthma   . Ovarian cyst      Review of Systems  All other systems reviewed and are negative.      Objective:   Physical Exam Vitals reviewed.  Constitutional:      Appearance: Normal appearance.  HENT:     Head: Normocephalic.     Right Ear: Tympanic membrane normal.     Left Ear: Tympanic membrane normal.     Nose: Nose normal.     Mouth/Throat:     Mouth: Mucous membranes are moist.  Eyes:     General:        Right eye: No discharge.        Left eye: No discharge.     Extraocular Movements: Extraocular movements intact.     Conjunctiva/sclera: Conjunctivae normal.     Pupils: Pupils  are equal, round, and reactive to light.     Comments: No stye or mass. Generalized swelling, erythema, scaliness of bilateral upper and lower eyelid of right eye.   Cardiovascular:     Rate and Rhythm: Normal rate and regular rhythm.  Pulmonary:     Effort: Pulmonary effort is normal.  Neurological:     Mental Status: She is alert.  Psychiatric:        Mood and Affect: Mood normal.           Assessment & Plan:  Marland KitchenMarland KitchenElianis was seen today for stye.  Diagnoses and all orders for this visit:  Squamous blepharitis of both upper and lower eyelid of right eye -     erythromycin ophthalmic ointment; Place 1 application into the right eye 3 (three) times daily for 7 days.   No red flags.  emycin ointment for 7 days then nightly for 4 weeks.  Fish oil 2000mg  three times a day.  Warm compresses.

## 2020-07-09 ENCOUNTER — Encounter: Payer: Self-pay | Admitting: Physician Assistant

## 2020-07-09 DIAGNOSIS — H0102A Squamous blepharitis right eye, upper and lower eyelids: Secondary | ICD-10-CM | POA: Insufficient documentation

## 2020-07-25 NOTE — Telephone Encounter (Signed)
Does she need appt ?

## 2020-07-26 MED ORDER — METHYLPREDNISOLONE 4 MG PO TBPK: ORAL_TABLET | ORAL | 0 refills | Status: DC

## 2020-07-26 MED ORDER — ERYTHROMYCIN 5 MG/GM OP OINT: 1.0000 "application " | TOPICAL_OINTMENT | Freq: Three times a day (TID) | OPHTHALMIC | 0 refills | Status: AC

## 2020-07-26 NOTE — Addendum Note (Signed)
Addended by: Donella Stade on: 07/26/2020 05:01 PM   Modules accepted: Orders

## 2020-08-29 ENCOUNTER — Encounter: Payer: Self-pay | Admitting: Nurse Practitioner

## 2020-08-29 ENCOUNTER — Telehealth (INDEPENDENT_AMBULATORY_CARE_PROVIDER_SITE_OTHER): Payer: BC Managed Care – PPO | Admitting: Nurse Practitioner

## 2020-08-29 DIAGNOSIS — Z7189 Other specified counseling: Secondary | ICD-10-CM

## 2020-08-29 NOTE — Progress Notes (Signed)
Virtual Visit via Telephone Note  I connected with  [name] on 08/29/20 at  2:30 PM EST by telephone and verified that I am speaking with the correct person using two identifiers.   I discussed the limitations, risks, security and privacy concerns of performing an evaluation and management service by telephone and the availability of in person appointments. I also discussed with the patient that there may be a patient responsible charge related to this service. The patient expressed understanding and agreed to proceed.  Participating parties included in this telephone visit include: The patient and the nurse practitioner only The patient is: at home I am: in the office  Subjective:    CC: Advice on holiday travel with COVID-19  HPI: Stephanie Hogan is a 39 year old female presenting today with questions and concerns about the COVID-19 virus and holiday travel plans. She reports that her husband recently began to experience a sore throat and was seen at an Urgent Care office for evaluation. His COVID-19 and strep tests were negative, but she was told that they would send both for culture and they were recommended to quarantine the family for 10 days and for her husband to remain out of work until January 1st. She is seeking guidance today on the recommendations by the CDC for quarantine, follow-up testing, and the accuracy of the tests available.  Past medical history, Surgical history, Family history not pertinant except as noted below, Social history, Allergies, and medications have been entered into the medical record, reviewed, and corrections made.   Review of Systems:  All review of systems negative except what is listed in the HPI  Objective:    General: Speaking clearly in complete sentences without any shortness of breath.   Alert and oriented x3.   Normal judgment.  No apparent acute distress.  Impression and Recommendations:    1. Counseled about COVID-19 virus  infection Discussed with patient the symptoms that are often presented with COVID-19 viral infection including, but not limited to, headache, sinus pain and pressure, fever, chills, malaise, body aches, congestion, sore throat, cough, fatigue, loss of taste, loss of smell, dizziness, nausea, vomiting, and diarrhea. Not all patients will experience all symptoms and some may only have a few. Vaccinated individuals tend to have less severe symptoms than unvaccinated individuals.  Many of the tests have been shown to be reliable for results, but there are instances where tests may show a false negative. This most often occurs due to improper testing technique or when the viral load is not high enough to be detected. Most often, when patients are showing symptoms, the viral load will be present on the tests provided. The PCR (or send away test) is the most accurate testing form we have at this time.  The CDC recommends that persons under surveillance for COVID-19 with recent known exposure and/or positive symptoms quarantine until a negative test result is received.  My recommendations were to repeat testing tomorrow, which would add an additional day for viral load to increase if it is present, and if second test is shown to be negative and his symptoms are not worsening, then it would be considered safe to assume that he is negative. This would not be a clear guarantee, but would give additional peace of mind.  We discussed that quarantine measures are to help prevent further possible spread of infection and with repeated negative tests, there would not be reasonable indication for continued quarantine.  Discussed that if she begins to  develop symptoms or if her husbands symptoms worsen, I do recommend follow-up evaluation. Although COVID and strep testing were negative, there are many viral infections present at this time and the flu is also present among our population.  Recommend protective measures if they  do choose to travel once negative second test has been received to ensure that they are not exposed while out of town.  All questions answered and recommendations went over with patient.       I discussed the assessment and treatment plan with the patient. The patient was provided an opportunity to ask questions and all were answered. The patient agreed with the plan and demonstrated an understanding of the instructions.   The patient was advised to call back or seek an in-person evaluation if the symptoms worsen or if the condition fails to improve as anticipated.  I provided 13 minutes of non-face-to-face time during this TELEPHONE encounter.    Stephanie Render, NP

## 2020-08-29 NOTE — Patient Instructions (Signed)
GET HELP RIGHT AWAY IF YOU HAVE EMERGENCY WARNING SIGNS** FOR COVID-19. If you or someone is showing any of these signs seek emergency medical care immediately. Call 911 or proceed to your closest emergency facility if: You develop worsening high fever. Trouble breathing Bluish lips or face Persistent pain or pressure in the chest New confusion Inability to wake or stay awake You cough up blood. Your symptoms become more severe    MAKE SURE YOU  Understand these instructions. Will watch your condition. Will get help right away if you are not doing well or get worse.   COVID-19 COVID-19 is a respiratory infection that is caused by a virus called severe acute respiratory syndrome coronavirus 2 (SARS-CoV-2). The disease is also known as coronavirus disease or novel coronavirus. In some people, the virus may not cause any symptoms. In others, it may cause a serious infection. The infection can get worse quickly and can lead to complications, such as:  Pneumonia, or infection of the lungs.  Acute respiratory distress syndrome or ARDS. This is a condition in which fluid build-up in the lungs prevents the lungs from filling with air and passing oxygen into the blood.  Acute respiratory failure. This is a condition in which there is not enough oxygen passing from the lungs to the body or when carbon dioxide is not passing from the lungs out of the body.  Sepsis or septic shock. This is a serious bodily reaction to an infection.  Blood clotting problems.  Secondary infections due to bacteria or fungus.  Organ failure. This is when your body's organs stop working. The virus that causes COVID-19 is contagious. This means that it can spread from person to person through droplets from coughs and sneezes (respiratory secretions). What are the causes? This illness is caused by a virus. You may catch the virus by:  Breathing in droplets from an infected person. Droplets can be spread by a  person breathing, speaking, singing, coughing, or sneezing.  Touching something, like a table or a doorknob, that was exposed to the virus (contaminated) and then touching your mouth, nose, or eyes. What increases the risk? Risk for infection You are more likely to be infected with this virus if you:  Are within 6 feet (2 meters) of a person with COVID-19.  Provide care for or live with a person who is infected with COVID-19.  Spend time in crowded indoor spaces or live in shared housing. Risk for serious illness You are more likely to become seriously ill from the virus if you:  Are 15 years of age or older. The higher your age, the more you are at risk for serious illness.  Live in a nursing home or long-term care facility.  Have cancer.  Have a long-term (chronic) disease such as: ? Chronic lung disease, including chronic obstructive pulmonary disease or asthma. ? A long-term disease that lowers your body's ability to fight infection (immunocompromised). ? Heart disease, including heart failure, a condition in which the arteries that lead to the heart become narrow or blocked (coronary artery disease), a disease which makes the heart muscle thick, weak, or stiff (cardiomyopathy). ? Diabetes. ? Chronic kidney disease. ? Sickle cell disease, a condition in which red blood cells have an abnormal "sickle" shape. ? Liver disease.  Are obese. What are the signs or symptoms? Symptoms of this condition can range from mild to severe. Symptoms may appear any time from 2 to 14 days after being exposed to the  virus. They include:  A fever or chills.  A cough.  Difficulty breathing.  Headaches, body aches, or muscle aches.  Runny or stuffy (congested) nose.  A sore throat.  New loss of taste or smell. Some people may also have stomach problems, such as nausea, vomiting, or diarrhea. Other people may not have any symptoms of COVID-19. How is this diagnosed? This condition may  be diagnosed based on:  Your signs and symptoms, especially if: ? You live in an area with a COVID-19 outbreak. ? You recently traveled to or from an area where the virus is common. ? You provide care for or live with a person who was diagnosed with COVID-19. ? You were exposed to a person who was diagnosed with COVID-19.  A physical exam.  Lab tests, which may include: ? Taking a sample of fluid from the back of your nose and throat (nasopharyngeal fluid), your nose, or your throat using a swab. ? A sample of mucus from your lungs (sputum). ? Blood tests.  Imaging tests, which may include, X-rays, CT scan, or ultrasound. How is this treated? At present, there is no medicine to treat COVID-19. Medicines that treat other diseases are being used on a trial basis to see if they are effective against COVID-19. Your health care provider will talk with you about ways to treat your symptoms. For most people, the infection is mild and can be managed at home with rest, fluids, and over-the-counter medicines. Treatment for a serious infection usually takes places in a hospital intensive care unit (ICU). It may include one or more of the following treatments. These treatments are given until your symptoms improve.  Receiving fluids and medicines through an IV.  Supplemental oxygen. Extra oxygen is given through a tube in the nose, a face mask, or a hood.  Positioning you to lie on your stomach (prone position). This makes it easier for oxygen to get into the lungs.  Continuous positive airway pressure (CPAP) or bi-level positive airway pressure (BPAP) machine. This treatment uses mild air pressure to keep the airways open. A tube that is connected to a motor delivers oxygen to the body.  Ventilator. This treatment moves air into and out of the lungs by using a tube that is placed in your windpipe.  Tracheostomy. This is a procedure to create a hole in the neck so that a breathing tube can be  inserted.  Extracorporeal membrane oxygenation (ECMO). This procedure gives the lungs a chance to recover by taking over the functions of the heart and lungs. It supplies oxygen to the body and removes carbon dioxide. Follow these instructions at home: Lifestyle  If you are sick, stay home except to get medical care. Your health care provider will tell you how long to stay home. Call your health care provider before you go for medical care.  Rest at home as told by your health care provider.  Do not use any products that contain nicotine or tobacco, such as cigarettes, e-cigarettes, and chewing tobacco. If you need help quitting, ask your health care provider.  Return to your normal activities as told by your health care provider. Ask your health care provider what activities are safe for you. General instructions  Take over-the-counter and prescription medicines only as told by your health care provider.  Drink enough fluid to keep your urine pale yellow.  Keep all follow-up visits as told by your health care provider. This is important. How is this prevented?  There  is no vaccine to help prevent COVID-19 infection. However, there are steps you can take to protect yourself and others from this virus. To protect yourself:   Do not travel to areas where COVID-19 is a risk. The areas where COVID-19 is reported change often. To identify high-risk areas and travel restrictions, check the CDC travel website: FatFares.com.br  If you live in, or must travel to, an area where COVID-19 is a risk, take precautions to avoid infection. ? Stay away from people who are sick. ? Wash your hands often with soap and water for 20 seconds. If soap and water are not available, use an alcohol-based hand sanitizer. ? Avoid touching your mouth, face, eyes, or nose. ? Avoid going out in public, follow guidance from your state and local health authorities. ? If you must go out in public, wear a  cloth face covering or face mask. Make sure your mask covers your nose and mouth. ? Avoid crowded indoor spaces. Stay at least 6 feet (2 meters) away from others. ? Disinfect objects and surfaces that are frequently touched every day. This may include:  Counters and tables.  Doorknobs and light switches.  Sinks and faucets.  Electronics, such as phones, remote controls, keyboards, computers, and tablets. To protect others: If you have symptoms of COVID-19, take steps to prevent the virus from spreading to others.  If you think you have a COVID-19 infection, contact your health care provider right away. Tell your health care team that you think you may have a COVID-19 infection.  Stay home. Leave your house only to seek medical care. Do not use public transport.  Do not travel while you are sick.  Wash your hands often with soap and water for 20 seconds. If soap and water are not available, use alcohol-based hand sanitizer.  Stay away from other members of your household. Let healthy household members care for children and pets, if possible. If you have to care for children or pets, wash your hands often and wear a mask. If possible, stay in your own room, separate from others. Use a different bathroom.  Make sure that all people in your household wash their hands well and often.  Cough or sneeze into a tissue or your sleeve or elbow. Do not cough or sneeze into your hand or into the air.  Wear a cloth face covering or face mask. Make sure your mask covers your nose and mouth. Where to find more information  Centers for Disease Control and Prevention: PurpleGadgets.be  World Health Organization: https://www.castaneda.info/ Contact a health care provider if:  You live in or have traveled to an area where COVID-19 is a risk and you have symptoms of the infection.  You have had contact with someone who has COVID-19 and you have symptoms of the  infection. Get help right away if:  You have trouble breathing.  You have pain or pressure in your chest.  You have confusion.  You have bluish lips and fingernails.  You have difficulty waking from sleep.  You have symptoms that get worse. These symptoms may represent a serious problem that is an emergency. Do not wait to see if the symptoms will go away. Get medical help right away. Call your local emergency services (911 in the [place] Do not drive yourself to the hospital. Let the emergency medical personnel know if you think you have COVID-19. Summary  COVID-19 is a respiratory infection that is caused by a virus. It is also known as  coronavirus disease or novel coronavirus. It can cause serious infections, such as pneumonia, acute respiratory distress syndrome, acute respiratory failure, or sepsis.  The virus that causes COVID-19 is contagious. This means that it can spread from person to person through droplets from breathing, speaking, singing, coughing, or sneezing.  You are more likely to develop a serious illness if you are 75 years of age or older, have a weak immune system, live in a nursing home, or have chronic disease.  There is no medicine to treat COVID-19. Your health care provider will talk with you about ways to treat your symptoms.  Take steps to protect yourself and others from infection. Wash your hands often and disinfect objects and surfaces that are frequently touched every day. Stay away from people who are sick and wear a mask if you are sick. This information is not intended to replace advice given to you by your health care provider. Make sure you discuss any questions you have with your health care provider. Document Revised: 06/23/2019 Document Reviewed: 09/29/2018 Elsevier Patient Education  [place].   Upper Respiratory Infection, Adult An upper respiratory infection (URI) affects the nose, throat, and upper air passages. URIs are caused by  germs (viruses). The most common type of URI is often called "the common cold." Medicines cannot cure URIs, but you can do things at home to relieve your symptoms. URIs usually get better within 7-10 days. Follow these instructions at home: Activity  Rest as needed.  If you have a fever, stay home from work or school until your fever is gone, or until your doctor says you may return to work or school. ? You should stay home until you cannot spread the infection anymore (you are not contagious). ? Your doctor may have you wear a face mask so you have less risk of spreading the infection. Relieving symptoms  Gargle with a salt-water mixture 3-4 times a day or as needed. To make a salt-water mixture, completely dissolve -1 tsp of salt in 1 cup of warm water.  Use a cool-mist humidifier to add moisture to the air. This can help you breathe more easily. Eating and drinking   Drink enough fluid to keep your pee (urine) pale yellow.  Eat soups and other clear broths. General instructions   Take over-the-counter and prescription medicines only as told by your doctor. These include cold medicines, fever reducers, and cough suppressants.  Do not use any products that contain nicotine or tobacco. These include cigarettes and e-cigarettes. If you need help quitting, ask your doctor.  Avoid being where people are smoking (avoid secondhand smoke).  Make sure you get regular shots and get the flu shot every year.  Keep all follow-up visits as told by your doctor. This is important. How to avoid spreading infection to others   Wash your hands often with soap and water. If you do not have soap and water, use hand sanitizer.  Avoid touching your mouth, face, eyes, or nose.  Cough or sneeze into a tissue or your sleeve or elbow. Do not cough or sneeze into your hand or into the air. Contact a doctor if:  You are getting worse, not better.  You have any of these: ? A  fever. ? Chills. ? Brown or red mucus in your nose. ? Yellow or brown fluid (discharge)coming from your nose. ? Pain in your face, especially when you bend forward. ? Swollen neck glands. ? Pain with swallowing. ? White areas in  the back of your throat. Get help right away if:  You have shortness of breath that gets worse.  You have very bad or constant: ? Headache. ? Ear pain. ? Pain in your forehead, behind your eyes, and over your cheekbones (sinus pain). ? Chest pain.  You have long-lasting (chronic) lung disease along with any of these: ? Wheezing. ? Long-lasting cough. ? Coughing up blood. ? A change in your usual mucus.  You have a stiff neck.  You have changes in your: ? Vision. ? Hearing. ? Thinking. ? Mood. Summary  An upper respiratory infection (URI) is caused by a germ called a virus. The most common type of URI is often called "the common cold."  URIs usually get better within 7-10 days.  Take over-the-counter and prescription medicines only as told by your doctor. This information is not intended to replace advice given to you by your health care provider. Make sure you discuss any questions you have with your health care provider. Document Revised: 09/01/2018 Document Reviewed: 04/16/2017 Elsevier Patient Education  [place].

## 2020-08-30 ENCOUNTER — Encounter: Payer: Self-pay | Admitting: Nurse Practitioner

## 2020-10-26 LAB — HM PAP SMEAR

## 2020-11-09 LAB — HM MAMMOGRAPHY

## 2021-01-27 ENCOUNTER — Telehealth (INDEPENDENT_AMBULATORY_CARE_PROVIDER_SITE_OTHER): Payer: BC Managed Care – PPO | Admitting: Physician Assistant

## 2021-01-27 ENCOUNTER — Encounter: Payer: Self-pay | Admitting: Physician Assistant

## 2021-01-27 DIAGNOSIS — R300 Dysuria: Secondary | ICD-10-CM

## 2021-01-27 DIAGNOSIS — R350 Frequency of micturition: Secondary | ICD-10-CM

## 2021-01-27 DIAGNOSIS — R1030 Lower abdominal pain, unspecified: Secondary | ICD-10-CM

## 2021-01-27 MED ORDER — CEPHALEXIN 500 MG PO CAPS: 500.0000 mg | ORAL_CAPSULE | Freq: Two times a day (BID) | ORAL | 0 refills | Status: DC

## 2021-01-27 NOTE — Progress Notes (Signed)
Started yesterday morning Pain with urination Lower abdominal pain No other symptoms  Started AZO yesterday afternoon Helping with symptoms

## 2021-01-27 NOTE — Progress Notes (Signed)
Patient ID: Stephanie Hogan, female   DOB: [birth year], 40 y.o.   MRN: [id number] .Marland KitchenVirtual Visit via Video Note  I connected with Claude Swendsen on 01/27/21 at 10:10 AM EDT by a video enabled telemedicine application and verified that I am speaking with the correct person using two identifiers.  Location: Patient:work Provider: clinic  .Marland KitchenParticipating in visit:  Patient: Stephanie Hogan Provider: Iran Planas PA-C   I discussed the limitations of evaluation and management by telemedicine and the availability of in person appointments. The patient expressed understanding and agreed to proceed.  History of Present Illness: Patient is a 40 year old female with history of UTI who presents virtually to the clinic with dysuria, lower abdominal discomfort, frequent urination.  She has had a UTI but is been over a year.  She denies any fever, chills, nausea, vomiting, flank pain, back pain.  She admits to having more sex trying to have a baby.  She feels like that this is the cause.  She denies any diarrhea.  She has burning with every urination as well as frequent urination.  .. Active Ambulatory Problems    Diagnosis Date Noted  . ENTERITIS, DUE TO VIRAL NEC 04/15/2007  . ROM 03/22/2008  . URI 09/05/2008  . ACUTE BRONCHOSPASM 03/15/2008  . SPRAIN/STRAIN, LUMBAR REGION 02/23/2007  . ACUTE BRONCHITIS 09/12/2010  . Grief reaction 11/17/2016  . ETD (Eustachian tube dysfunction), right 06/17/2017  . Acute recurrent maxillary sinusitis 06/17/2017  . Hematoma 10/11/2018  . Squamous blepharitis of both upper and lower eyelid of right eye 07/09/2020   Resolved Ambulatory Problems    Diagnosis Date Noted  . No Resolved Ambulatory Problems   Past Medical History:  Diagnosis Date  . Asthma   . Ovarian cyst    See HPI.    Observations/Objective: No acute distress Normal mood and appearance Normal breathing   .Marland KitchenThere were no vitals filed for this visit. There is no height or weight on file to calculate  BMI.     Assessment and Plan: Marland KitchenMarland KitchenHeather was seen today for urinary tract infection.  Diagnoses and all orders for this visit:  Dysuria -     cephALEXin (KEFLEX) 500 MG capsule; Take 1 capsule (500 mg total) by mouth 2 (two) times daily. For 5 days.  Lower abdominal pain -     cephALEXin (KEFLEX) 500 MG capsule; Take 1 capsule (500 mg total) by mouth 2 (two) times daily. For 5 days.  Urinary frequency -     cephALEXin (KEFLEX) 500 MG capsule; Take 1 capsule (500 mg total) by mouth 2 (two) times daily. For 5 days.   Hx of UTI but has been a while. Pt is having more sex as they are trying to have a baby. No red flags. Empirically start keflex that is also safe in pregnancy. If symptoms persist or worsen need UA and culture. Symptomatic care discussed.    Follow Up Instructions:    I discussed the assessment and treatment plan with the patient. The patient was provided an opportunity to ask questions and all were answered. The patient agreed with the plan and demonstrated an understanding of the instructions.   The patient was advised to call back or seek an in-person evaluation if the symptoms worsen or if the condition fails to improve as anticipated.    Iran Planas, PA-C

## 2021-05-28 ENCOUNTER — Other Ambulatory Visit: Payer: Self-pay

## 2021-05-28 ENCOUNTER — Emergency Department
Admission: EM | Admit: 2021-05-28 | Discharge: 2021-05-28 | Disposition: A | Discharge status: Home / Self Care | Payer: BC Managed Care – PPO | Source: Home / Self Care

## 2021-05-28 DIAGNOSIS — R1030 Lower abdominal pain, unspecified: Secondary | ICD-10-CM

## 2021-05-28 DIAGNOSIS — R300 Dysuria: Secondary | ICD-10-CM

## 2021-05-28 DIAGNOSIS — N390 Urinary tract infection, site not specified: Secondary | ICD-10-CM

## 2021-05-28 DIAGNOSIS — R350 Frequency of micturition: Secondary | ICD-10-CM

## 2021-05-28 LAB — POCT URINALYSIS DIP (MANUAL ENTRY)
Bilirubin, UA: NEGATIVE
Glucose, UA: 100 mg/dL — AB
Nitrite, UA: POSITIVE — AB
Protein Ur, POC: >=300 mg/dL — AB
Spec Grav, UA: 1.025 (ref 1.010–1.025)
Urobilinogen, UA: 2 E.U./dL — AB
pH, UA: 6 (ref 5.0–8.0)

## 2021-05-28 MED ORDER — CEPHALEXIN 500 MG PO CAPS: 500.0000 mg | ORAL_CAPSULE | Freq: Two times a day (BID) | ORAL | 0 refills | Status: DC

## 2021-05-28 NOTE — ED Provider Notes (Signed)
[name]    CSN: [id number] Arrival date & time: 05/28/21  1823      History   Chief Complaint Chief Complaint  Patient presents with   Dysuria    HPI Stephanie Hogan is a 40 y.o. female.   HPI Patient feels like she has a bladder infection.  Dysuria and frequency.  No blood in her urine.  No cloudy urine.  No abdominal pain.  No flank pain.  No nausea or vomiting.  No fever or chills Patient states she and her husband are trying to get pregnant.  Would like a bladder infection medication that is safe to take in early pregnancy, "just in case"  Past Medical History:  Diagnosis Date   Asthma    Ovarian cyst     Patient Active Problem List   Diagnosis Date Noted   Squamous blepharitis of both upper and lower eyelid of right eye 07/09/2020   Hematoma 10/11/2018   ETD (Eustachian tube dysfunction), right 06/17/2017   Acute recurrent maxillary sinusitis 06/17/2017   Grief reaction 11/17/2016   ACUTE BRONCHITIS 09/12/2010   URI 09/05/2008   ROM 03/22/2008   ACUTE BRONCHOSPASM 03/15/2008   ENTERITIS, DUE TO VIRAL NEC 04/15/2007   SPRAIN/STRAIN, LUMBAR REGION 02/23/2007    Past Surgical History:  Procedure Laterality Date   CHOLECYSTECTOMY     KNEE SURGERY     MOLE REMOVAL      OB History     Gravida  1   Para  0   Term  0   Preterm  0   AB  0   Living  0      SAB  0   IAB  0   Ectopic  0   Multiple  0   Live Births               Home Medications    Prior to Admission medications   Medication Sig Start Date End Date Taking? Authorizing Provider  cephALEXin (KEFLEX) 500 MG capsule Take 1 capsule (500 mg total) by mouth 2 (two) times daily. For 5 days. 05/28/21   Raylene Everts, MD  Prenatal Vit-Fe Fumarate-FA (PRENATAL VITAMIN PO) Take by mouth.    [provider]    Family History Family History  Problem Relation Age of Onset   Lung cancer Maternal Grandfather    Breast cancer Maternal Grandmother     Alzheimer's disease Paternal Grandfather     Social History Social History   Tobacco Use   Smoking status: Never   Smokeless tobacco: Never  Vaping Use   Vaping Use: Never used  Substance Use Topics   Alcohol use: Yes    Alcohol/week: 2.0 standard drinks    Types: 2 Standard drinks or equivalent per week    Comment: Occasional   Drug use: No     Allergies   Patient has no known allergies.   Review of Systems Review of Systems See HPI  Physical Exam Triage Vital Signs ED Triage Vitals  Enc Vitals Group     BP 05/28/21 1844 123/78     Pulse Rate 05/28/21 1844 90     Resp --      Temp 05/28/21 1844 98.7 F (37.1 C)     Temp Source 05/28/21 1844 Oral     SpO2 05/28/21 1844 96 %     Weight --      Height --      Head Circumference --  Peak Flow --      Pain Score 05/28/21 1845 1     Pain Loc --      Pain Edu? --      Excl. in [place]? --    No data found.  Updated Vital Signs BP 123/78 (BP Location: Left Arm)   Pulse 90   Temp 98.7 F (37.1 C) (Oral)   LMP 05/16/2021 (Exact Date)   SpO2 96%      Physical Exam Constitutional:      General: She is not in acute distress.    Appearance: She is well-developed.  HENT:     Head: Normocephalic and atraumatic.     Mouth/Throat:     Comments: Mask is in place Eyes:     Conjunctiva/sclera: Conjunctivae normal.     Pupils: Pupils are equal, round, and reactive to light.  Cardiovascular:     Rate and Rhythm: Normal rate.  Pulmonary:     Effort: Pulmonary effort is normal. No respiratory distress.  Abdominal:     General: There is no distension.     Palpations: Abdomen is soft.     Tenderness: There is no abdominal tenderness. There is no right CVA tenderness or left CVA tenderness.  Musculoskeletal:        General: Normal range of motion.     Cervical back: Normal range of motion.  Skin:    General: Skin is warm and dry.  Neurological:     Mental Status: She is alert.     UC Treatments / Results   Labs (all labs ordered are listed, but only abnormal results are displayed) Labs Reviewed  POCT URINALYSIS DIP (MANUAL ENTRY) - Abnormal; Notable for the following components:      Result Value   Color, UA orange (*)    Clarity, UA cloudy (*)    Glucose, UA =100 (*)    Ketones, POC UA small (15) (*)    Blood, UA moderate (*)    Protein Ur, POC >=300 (*)    Urobilinogen, UA 2.0 (*)    Nitrite, UA Positive (*)    Leukocytes, UA Large (3+) (*)    All other components within normal limits  URINE CULTURE    EKG   Radiology No results found.  Procedures Procedures (including critical care time)  Medications Ordered in UC Medications - No data to display  Initial Impression / Assessment and Plan / UC Course  I have reviewed the triage vital signs and the nursing notes.  Pertinent labs & imaging results that were available during my care of the patient were reviewed by me and considered in my medical decision making (see chart for details).     I explained to the patient that use of Azo interferes with her ability to do accurate urinalysis testing.  We will send for culture.  We will start her on Keflex 2 times a day for 5 days. Final Clinical Impressions(s) / UC Diagnoses   Final diagnoses:  Dysuria  Lower urinary tract infectious disease  Urinary frequency  Lower abdominal pain     Discharge Instructions      Take cephalexin 2 times a day for 5 days Increase your water May continue Azo for urinary pain  Check MyChart for urine urine culture report.  You will be called if any change in antibiotics is necessary   ED Prescriptions     Medication Sig Dispense Auth. Provider   cephALEXin (KEFLEX) 500 MG capsule Take 1 capsule (500 mg  total) by mouth 2 (two) times daily. For 5 days. 10 capsule Raylene Everts, MD      PDMP not reviewed this encounter.   Raylene Everts, MD 05/28/21 [phone]

## 2021-05-28 NOTE — Discharge Instructions (Signed)
Take cephalexin 2 times a day for 5 days Increase your water May continue Azo for urinary pain  Check MyChart for urine urine culture report.  You will be called if any change in antibiotics is necessary

## 2021-05-28 NOTE — ED Triage Notes (Signed)
Pt c/o dysuria and urinary frequency since this am. Hx of frequent UTIs. Taking azo prn. Last dose around 130pm.

## 2021-05-30 LAB — URINE CULTURE
MICRO NUMBER:: 12406877
SPECIMEN QUALITY:: ADEQUATE

## 2021-10-23 ENCOUNTER — Telehealth: Payer: BC Managed Care – PPO | Admitting: Physician Assistant

## 2021-10-23 DIAGNOSIS — B9689 Other specified bacterial agents as the cause of diseases classified elsewhere: Secondary | ICD-10-CM

## 2021-10-23 DIAGNOSIS — J019 Acute sinusitis, unspecified: Secondary | ICD-10-CM

## 2021-10-23 MED ORDER — BENZONATATE 100 MG PO CAPS: 100.0000 mg | ORAL_CAPSULE | Freq: Three times a day (TID) | ORAL | 0 refills | Status: DC | PRN

## 2021-10-23 MED ORDER — AMOXICILLIN-POT CLAVULANATE 875-125 MG PO TABS: 1.0000 | ORAL_TABLET | Freq: Two times a day (BID) | ORAL | 0 refills | Status: DC

## 2021-10-23 MED ORDER — FLUTICASONE PROPIONATE 50 MCG/ACT NA SUSP: 2.0000 | Freq: Every day | NASAL | 0 refills | Status: DC

## 2021-10-23 NOTE — Patient Instructions (Signed)
Cheral Marker, thank you for joining Leeanne Rio, PA-C for today's virtual visit.  While this provider is not your primary care provider (PCP), if your PCP is located in our provider database this encounter information will be shared with them immediately following your visit.  Consent: (Patient) Stephanie Hogan provided verbal consent for this virtual visit at the beginning of the encounter.  Current Medications:  Current Outpatient Medications:    metFORMIN (GLUCOPHAGE) 500 MG tablet, Take 500 mg by mouth 2 (two) times daily., Disp: , Rfl:    Prenatal Vit-Fe Fumarate-FA (PRENATAL VITAMIN PO), Take by mouth., Disp: , Rfl:    Medications ordered in this encounter:  No orders of the defined types were placed in this encounter.    *If you need refills on other medications prior to your next appointment, please contact your pharmacy*  Follow-Up: Call back or seek an in-person evaluation if the symptoms worsen or if the condition fails to improve as anticipated.  Other Instructions Please take antibiotic as directed.  Increase fluid intake.  Use Saline nasal spray.  Take a daily multivitamin. Use the Flonase as directed. You can use Mucinex Sinus Max as directed.  Place a humidifier in the bedroom.  Please call or return clinic if symptoms are not improving.  Sinusitis Sinusitis is redness, soreness, and swelling (inflammation) of the paranasal sinuses. Paranasal sinuses are air pockets within the bones of your face (beneath the eyes, the middle of the forehead, or above the eyes). In healthy paranasal sinuses, mucus is able to drain out, and air is able to circulate through them by way of your nose. However, when your paranasal sinuses are inflamed, mucus and air can become trapped. This can allow bacteria and other germs to grow and cause infection. Sinusitis can develop quickly and last only a short time (acute) or continue over a long period (chronic). Sinusitis that lasts for more than 12  weeks is considered chronic.  CAUSES  Causes of sinusitis include: Allergies. Structural abnormalities, such as displacement of the cartilage that separates your nostrils (deviated septum), which can decrease the air flow through your nose and sinuses and affect sinus drainage. Functional abnormalities, such as when the small hairs (cilia) that line your sinuses and help remove mucus do not work properly or are not present. SYMPTOMS  Symptoms of acute and chronic sinusitis are the same. The primary symptoms are pain and pressure around the affected sinuses. Other symptoms include: Upper toothache. Earache. Headache. Bad breath. Decreased sense of smell and taste. A cough, which worsens when you are lying flat. Fatigue. Fever. Thick drainage from your nose, which often is green and may contain pus (purulent). Swelling and warmth over the affected sinuses. DIAGNOSIS  Your caregiver will perform a physical exam. During the exam, your caregiver may: Look in your nose for signs of abnormal growths in your nostrils (nasal polyps). Tap over the affected sinus to check for signs of infection. View the inside of your sinuses (endoscopy) with a special imaging device with a light attached (endoscope), which is inserted into your sinuses. If your caregiver suspects that you have chronic sinusitis, one or more of the following tests may be recommended: Allergy tests. Nasal culture A sample of mucus is taken from your nose and sent to a lab and screened for bacteria. Nasal cytology A sample of mucus is taken from your nose and examined by your caregiver to determine if your sinusitis is related to an allergy. TREATMENT  Most cases of  acute sinusitis are related to a viral infection and will resolve on their own within 10 days. Sometimes medicines are prescribed to help relieve symptoms (pain medicine, decongestants, nasal steroid sprays, or saline sprays).  However, for sinusitis related to a  bacterial infection, your caregiver will prescribe antibiotic medicines. These are medicines that will help kill the bacteria causing the infection.  Rarely, sinusitis is caused by a fungal infection. In theses cases, your caregiver will prescribe antifungal medicine. For some cases of chronic sinusitis, surgery is needed. Generally, these are cases in which sinusitis recurs more than 3 times per year, despite other treatments. HOME CARE INSTRUCTIONS  Drink plenty of water. Water helps thin the mucus so your sinuses can drain more easily. Use a humidifier. Inhale steam 3 to 4 times a day (for example, sit in the bathroom with the shower running). Apply a warm, moist washcloth to your face 3 to 4 times a day, or as directed by your caregiver. Use saline nasal sprays to help moisten and clean your sinuses. Take over-the-counter or prescription medicines for pain, discomfort, or fever only as directed by your caregiver. SEEK IMMEDIATE MEDICAL CARE IF: You have increasing pain or severe headaches. You have nausea, vomiting, or drowsiness. You have swelling around your face. You have vision problems. You have a stiff neck. You have difficulty breathing. MAKE SURE YOU:  Understand these instructions. Will watch your condition. Will get help right away if you are not doing well or get worse. Document Released: 08/24/2005 Document Revised: 11/16/2011 Document Reviewed: 09/08/2011 [facility] Patient Information 2014 [place], [place].    If you have been instructed to have an in-person evaluation today at a local Urgent Care facility, please use the link below. It will take you to a list of all of our available [facility], including address, phone number and hours of operation. Please do not delay care.  [facility]  If you or a family member do not have a primary care provider, use the link below to schedule a visit and establish care. When you choose a [facility] primary  care physician or advanced practice provider, you gain a long-term partner in health. Find a Primary Care Provider  Learn more about [facility]'s in-office and virtual care options: [facility]

## 2021-10-23 NOTE — Progress Notes (Signed)
Virtual Visit Consent   Stephanie Hogan, you are scheduled for a virtual visit with a [facility] provider today.     Just as with appointments in the office, your consent must be obtained to participate.  Your consent will be active for this visit and any virtual visit you may have with one of our providers in the next 365 days.     If you have a MyChart account, a copy of this consent can be sent to you electronically.  All virtual visits are billed to your insurance company just like a traditional visit in the office.    As this is a virtual visit, video technology does not allow for your provider to perform a traditional examination.  This may limit your provider's ability to fully assess your condition.  If your provider identifies any concerns that need to be evaluated in person or the need to arrange testing (such as labs, EKG, etc.), we will make arrangements to do so.     Although advances in technology are sophisticated, we cannot ensure that it will always work on either your end or our end.  If the connection with a video visit is poor, the visit may have to be switched to a telephone visit.  With either a video or telephone visit, we are not always able to ensure that we have a secure connection.     I need to obtain your verbal consent now.   Are you willing to proceed with your visit today?    Stephanie Hogan has provided verbal consent on 10/23/2021 for a virtual visit (video or telephone).   Leeanne Rio, [place]   Date: 10/23/2021 3:09 PM   Virtual Visit via Video Note   I, Leeanne Rio, connected with  Stephanie Hogan  ([id number], September 19, 1980) on 10/23/21 at  3:00 PM EST by a video-enabled telemedicine application and verified that I am speaking with the correct person using two identifiers.  Location: Patient: Virtual Visit Location Patient: Home Provider: Virtual Visit Location Provider: Home Office   I discussed the limitations of evaluation and management by telemedicine  and the availability of in person appointments. The patient expressed understanding and agreed to proceed.    History of Present Illness: Stephanie Hogan is a 41 y.o. who identifies as a female who was assigned female at birth, and is being seen today for possible sinusitis. Notes experiencing nasal and head congestion with sinus pressure x 5 days. Has taken COVID tests x 2 which were negative. Cough is significant but especially at nighttime. Has history of intermittent asthma but no change in breathing at present. Denies fever, chills or aches. Denies recent travel or sick contact. Has history of sinusitis and usually gets them several times per year.   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 07/09/2020   Hematoma 10/11/2018   ETD (Eustachian tube dysfunction), right 06/17/2017   Acute recurrent maxillary sinusitis 06/17/2017   Grief reaction 11/17/2016   ACUTE BRONCHITIS 09/12/2010   URI 09/05/2008   ROM 03/22/2008   ACUTE BRONCHOSPASM 03/15/2008   ENTERITIS, DUE TO VIRAL NEC 04/15/2007   SPRAIN/STRAIN, LUMBAR REGION 02/23/2007    Allergies: No Known Allergies Medications:  Current Outpatient Medications:    amoxicillin-clavulanate (AUGMENTIN) 875-125 MG tablet, Take 1 tablet by mouth 2 (two) times daily., Disp: 14 tablet, Rfl: 0   benzonatate (TESSALON) 100 MG capsule,  Take 1 capsule (100 mg total) by mouth 3 (three) times daily as needed for cough., Disp: 30 capsule, Rfl: 0   fluticasone (FLONASE) 50 MCG/ACT nasal spray, Place 2 sprays into both nostrils daily., Disp: 16 g, Rfl: 0   metFORMIN (GLUCOPHAGE) 500 MG tablet, Take 500 mg by mouth 2 (two) times daily., Disp: , Rfl:    Prenatal Vit-Fe Fumarate-FA (PRENATAL VITAMIN PO), Take by mouth., Disp: , Rfl:   Observations/Objective: Patient is well-developed, well-nourished in no acute distress.  Resting comfortably at home.  Head is  normocephalic, atraumatic.  No labored breathing. Speech is clear and coherent with logical content.  Patient is alert and oriented at baseline.   Assessment and Plan: 1. Acute bacterial sinusitis - amoxicillin-clavulanate (AUGMENTIN) 875-125 MG tablet; Take 1 tablet by mouth 2 (two) times daily.  Dispense: 14 tablet; Refill: 0 - fluticasone (FLONASE) 50 MCG/ACT nasal spray; Place 2 sprays into both nostrils daily.  Dispense: 16 g; Refill: 0 - benzonatate (TESSALON) 100 MG capsule; Take 1 capsule (100 mg total) by mouth 3 (three) times daily as needed for cough.  Dispense: 30 capsule; Refill: 0  Rx Augmentin.  Increase fluids.  Rest.  Saline nasal spray.  Probiotic.  Mucinex as directed.  Humidifier in bedroom. Flonase per orders. Tessalon per orders.  Call or return to clinic if symptoms are not improving.   Follow Up Instructions: I discussed the assessment and treatment plan with the patient. The patient was provided an opportunity to ask questions and all were answered. The patient agreed with the plan and demonstrated an understanding of the instructions.  A copy of instructions were sent to the patient via MyChart unless otherwise noted below.   The patient was advised to call back or seek an in-person evaluation if the symptoms worsen or if the condition fails to improve as anticipated.  Time:  I spent 10 minutes with the patient via telehealth technology discussing the above problems/concerns.    Leeanne Rio, PA-C

## 2021-10-24 ENCOUNTER — Ambulatory Visit: Payer: BC Managed Care – PPO | Admitting: Physician Assistant

## 2021-10-27 ENCOUNTER — Telehealth: Payer: BC Managed Care – PPO | Admitting: Physician Assistant

## 2022-02-04 ENCOUNTER — Ambulatory Visit: Payer: BC Managed Care – PPO | Admitting: Physician Assistant

## 2022-02-05 ENCOUNTER — Other Ambulatory Visit: Payer: Self-pay | Admitting: Physician Assistant

## 2022-02-05 ENCOUNTER — Ambulatory Visit: Payer: BC Managed Care – PPO | Admitting: Physician Assistant

## 2022-02-05 ENCOUNTER — Encounter: Payer: Self-pay | Admitting: Physician Assistant

## 2022-02-05 VITALS — BP 128/88 | HR 88 | Ht 62.0 in | Wt 172.0 lb

## 2022-02-05 DIAGNOSIS — D229 Melanocytic nevi, unspecified: Secondary | ICD-10-CM | POA: Insufficient documentation

## 2022-02-05 NOTE — Progress Notes (Signed)
   Established Patient Office Visit  Subjective   Patient ID: Stephanie Hogan, female    DOB: 01-Dec-1980  Age: 41 y.o. MRN: [id number]  Chief Complaint  Patient presents with  . Mass    HPI Pt is a 41 yo female who presents to the clinic with concerning papule of left upper chest wall. Her bra strap irritates it. She has noticed it for 6 months and does seem to be getting bigger. Has not bled. She wants to get rid of it.    ROS See HPI.    Objective:     BP (!) 127/112   Pulse 88   Ht '5\' 2"'$  (1.575 m)   Wt 172 lb (78 kg)   SpO2 98%   BMI 31.46 kg/m  BP Readings from Last 3 Encounters:  02/05/22 (!) 127/112  05/28/21 123/78  07/08/20 120/72      Physical Exam 65m by 230mraised blue papule on left chest wall.     Assessment & Plan:  ..[name] seen today for mass.  Diagnoses and all orders for this visit:  Suspicious nevus -     Surgical pathology    No follow-ups on file.    Stephanie PlanasPA-C

## 2022-02-05 NOTE — Patient Instructions (Signed)
Skin Biopsy, Care After The following information offers guidance on how to care for yourself after your procedure. Your health care provider may also give you more specific instructions. If you have problems or questions, contact your health care provider. What can I expect after the procedure? After the procedure, it is common to have: Soreness or mild pain. Bruising. Itching. Some redness and swelling. Follow these instructions at home: Biopsy site care  Follow instructions from your health care provider about how to take care of your biopsy site. Make sure you: Wash your hands with soap and water for at least 20 seconds before and after you change your bandage (dressing). If soap and water are not available, use hand sanitizer. Change your dressing as told by your health care provider. Leave stitches (sutures), skin glue, or adhesive strips in place. These skin closures may need to stay in place for 2 weeks or longer. If adhesive strip edges start to loosen and curl up, you may trim the loose edges. Do not remove adhesive strips completely unless your health care provider tells you to do that. Check your biopsy site every day for signs of infection. Check for: More redness, swelling, or pain. Fluid or blood. Warmth. Pus or a bad smell. Do not take baths, swim, or use a hot tub until your health care provider approves. Ask your health care provider if you may take showers. You may only be allowed to take sponge baths. General instructions Take over-the-counter and prescription medicines only as told by your health care provider. Return to your normal activities as told by your health care provider. Ask your health care provider what activities are safe for you. Keep all follow-up visits. This is important. Contact a health care provider if: You have more redness, swelling, or pain around your biopsy site. You have fluid or blood coming from your biopsy site. Your biopsy site feels warm  to the touch. You have pus or a bad smell coming from your biopsy site. You have a fever. Your sutures, skin glue, or adhesive strips loosen or come off sooner than expected. Get help right away if: You have bleeding that does not stop with pressure or a dressing. Summary After the procedure, it is common to have soreness, bruising, and itching at the site. Follow instructions from your health care provider about how to take care of your biopsy site. Check your biopsy site every day for signs of infection. Contact a health care provider if you have more redness, swelling, or pain around your biopsy site, or your biopsy site feels warm to the touch. Keep all follow-up visits. This is important. This information is not intended to replace advice given to you by your health care provider. Make sure you discuss any questions you have with your health care provider. Document Revised: 03/25/2021 Document Reviewed: 03/25/2021 Elsevier Patient Education  2023 Elsevier Inc.  

## 2022-02-09 NOTE — Progress Notes (Signed)
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 GREAT news.

## 2022-04-08 ENCOUNTER — Encounter: Payer: Self-pay | Admitting: Neurology

## 2022-06-04 ENCOUNTER — Encounter: Payer: Self-pay | Admitting: Physician Assistant

## 2022-08-13 ENCOUNTER — Ambulatory Visit: Payer: BC Managed Care – PPO | Admitting: Family Medicine

## 2022-08-13 ENCOUNTER — Encounter: Payer: Self-pay | Admitting: Family Medicine

## 2022-08-13 VITALS — BP 114/73 | HR 92 | Temp 97.7°F | Ht 62.0 in | Wt 195.0 lb

## 2022-08-13 DIAGNOSIS — J0110 Acute frontal sinusitis, unspecified: Secondary | ICD-10-CM | POA: Diagnosis not present

## 2022-08-13 MED ORDER — AZITHROMYCIN 250 MG PO TABS: ORAL_TABLET | ORAL | 0 refills | Status: AC

## 2022-08-13 NOTE — Progress Notes (Signed)
Acute Office Visit  Subjective:     Patient ID: Stephanie Hogan, female    DOB: October 26, 1980, 41 y.o.   MRN: [id number]  Chief Complaint  Patient presents with   Sinus Problem    Patient in office c/o  burning sensation in nose, nasal drainage,  slight ST, blood tinged nasal mucus x yesterday     HPI Patient is in today for acute visit.  Pt reports yesterday she felt like she's having nasal congestion. She has some blood tinged phlegm from her nose today. She reports some cough that's mild. She also has asthma so the cough comes and goes chronically for her, no worse. She isn't allergic to anything. She has been trying OTC alka seltzer for her symptoms. No ear pain some sore throat.   Review of Systems  HENT:  Positive for congestion, sinus pain and sore throat. Negative for nosebleeds.   Respiratory:  Positive for cough. Negative for shortness of breath and wheezing.   Cardiovascular:  Negative for chest pain and claudication.  Gastrointestinal:  Negative for heartburn and nausea.        Objective:    BP 114/73   Pulse 92   Temp 97.7 F (36.5 C)   Ht '5\' 2"'$  (1.575 m)   Wt 195 lb (88.5 kg)   SpO2 100%   BMI 35.67 kg/m    Physical Exam Vitals and nursing note reviewed.  Constitutional:      Appearance: Normal appearance. She is normal weight.  HENT:     Head: Normocephalic and atraumatic.     Right Ear: Tympanic membrane, ear canal and external ear normal.     Left Ear: Tympanic membrane, ear canal and external ear normal.     Nose: Nose normal.     Mouth/Throat:     Mouth: Mucous membranes are moist.     Pharynx: Oropharynx is clear.  Eyes:     Conjunctiva/sclera: Conjunctivae normal.     Pupils: Pupils are equal, round, and reactive to light.  Cardiovascular:     Rate and Rhythm: Normal rate and regular rhythm.     Pulses: Normal pulses.     Heart sounds: Normal heart sounds.  Pulmonary:     Effort: Pulmonary effort is normal.     Breath sounds: Normal breath  sounds.  Abdominal:     General: Abdomen is flat. Bowel sounds are normal.  Skin:    General: Skin is warm.     Capillary Refill: Capillary refill takes less than 2 seconds.  Neurological:     Mental Status: She is alert. Mental status is at baseline.     Cranial Nerves: Cranial nerve deficit present.  Psychiatric:        Mood and Affect: Mood normal.        Behavior: Behavior normal.        Thought Content: Thought content normal.        Judgment: Judgment normal.    No results found for any visits on 08/13/22.      Assessment & Plan:   Problem List Items Addressed This Visit   None Visit Diagnoses     Acute non-recurrent frontal sinusitis    -  Primary   Relevant Medications   azithromycin (ZITHROMAX) 250 MG tablet       Meds ordered this encounter  Medications   azithromycin (ZITHROMAX) 250 MG tablet    Sig: Take 2 tablets on day 1, then 1 tablet daily on days 2 through 5  Dispense:  6 tablet    Refill:  0  Symptoms consistent with sinusitis. Sent in zpack. Advised to get otc nasal saline spray to use prn.    No follow-ups on file.  Leeanne Rio, MD

## 2022-09-25 ENCOUNTER — Ambulatory Visit: Admit: 2022-09-25 | Discharge status: Absent without leave | Payer: BC Managed Care – PPO

## 2022-09-25 ENCOUNTER — Encounter: Payer: Self-pay | Admitting: Emergency Medicine

## 2022-09-25 ENCOUNTER — Ambulatory Visit
Admission: EM | Admit: 2022-09-25 | Discharge: 2022-09-25 | Disposition: A | Discharge status: Home / Self Care | Payer: BC Managed Care – PPO | Attending: Physician Assistant | Admitting: Physician Assistant

## 2022-09-25 DIAGNOSIS — N3000 Acute cystitis without hematuria: Secondary | ICD-10-CM | POA: Diagnosis not present

## 2022-09-25 LAB — POCT URINALYSIS DIP (MANUAL ENTRY)
Glucose, UA: NEGATIVE mg/dL
Ketones, POC UA: NEGATIVE mg/dL
Nitrite, UA: NEGATIVE
Protein Ur, POC: >=300 mg/dL — AB
Spec Grav, UA: >=1.03 — AB (ref 1.010–1.025)
Urobilinogen, UA: 1 E.U./dL
pH, UA: 6 (ref 5.0–8.0)

## 2022-09-25 MED ORDER — CEPHALEXIN 500 MG PO CAPS: 500.0000 mg | ORAL_CAPSULE | Freq: Four times a day (QID) | ORAL | 0 refills | Status: AC

## 2022-09-25 MED ORDER — ONDANSETRON HCL 4 MG PO TABS: 4.0000 mg | ORAL_TABLET | Freq: Every day | ORAL | 0 refills | Status: DC | PRN

## 2022-09-25 NOTE — ED Triage Notes (Signed)
Patient c/o possible UTI, having dysuria, urgency and some hematuria.  Patient is currently on her menstrual cycle.

## 2022-09-30 NOTE — ED Provider Notes (Signed)
[name]    CSN: [id number] Arrival date & time: 09/25/22  1231      History   Chief Complaint Chief Complaint  Patient presents with   Dysuria    HPI Stephanie Hogan is a 42 y.o. female.   Patient complains of UTI symptoms.   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 07/09/2020   Hematoma 10/11/2018   ETD (Eustachian tube dysfunction), right 06/17/2017   Acute recurrent maxillary sinusitis 06/17/2017   Grief reaction 11/17/2016   ACUTE BRONCHITIS 09/12/2010   URI 09/05/2008   ROM 03/22/2008   ACUTE BRONCHOSPASM 03/15/2008   ENTERITIS, DUE TO VIRAL NEC 04/15/2007   SPRAIN/STRAIN, LUMBAR REGION 02/23/2007    Past Surgical History:  Procedure Laterality Date   CHOLECYSTECTOMY     KNEE SURGERY     MOLE REMOVAL      OB History     Gravida  1   Para  0   Term  0   Preterm  0   AB  0   Living  0      SAB  0   IAB  0   Ectopic  0   Multiple  0   Live Births               Home Medications    Prior to Admission medications   Medication Sig Start Date End Date Taking? Authorizing Provider  cephALEXin (KEFLEX) 500 MG capsule Take 1 capsule (500 mg total) by mouth 4 (four) times daily for 10 days. 09/25/22 10/05/22 Yes Fransico Meadow, PA-C  metFORMIN (GLUCOPHAGE) 500 MG tablet Take 500 mg by mouth 2 (two) times daily. 10/11/21  Yes [provider]  ondansetron (ZOFRAN) 4 MG tablet Take 1 tablet (4 mg total) by mouth daily as needed for nausea or vomiting. 09/25/22 09/25/23 Yes Fransico Meadow, PA-C  Prenatal Vit-Fe Fumarate-FA (PRENATAL VITAMIN PO) Take by mouth.   Yes [provider]    Family History Family  History  Problem Relation Age of Onset   Breast cancer Maternal Grandmother    Lung cancer Maternal Grandfather    Alzheimer's disease Paternal Grandfather     Social History Social History   Tobacco Use   Smoking status: Never   Smokeless tobacco: Never  Vaping Use   Vaping Use: Never used  Substance Use Topics   Alcohol use: Yes    Alcohol/week: 2.0 standard drinks of alcohol    Types: 2 Standard drinks or equivalent per week    Comment: Occasional   Drug use: No     Allergies   Patient has no known allergies.   Review of Systems Review of Systems  Genitourinary:  Positive for dysuria.  All other systems reviewed and are negative.    Physical Exam Triage Vital Signs ED Triage Vitals  Enc Vitals Group     BP 09/25/22 1250 (!) 147/82     Pulse Rate 09/25/22 1250 89     Resp 09/25/22 1250 18     Temp 09/25/22 1250 99 F (37.2 C)     Temp Source 09/25/22 1250 Oral  SpO2 09/25/22 1250 95 %     Weight 09/25/22 1252 185 lb (83.9 kg)     Height 09/25/22 1252 '5\' 2"'$  (1.575 m)     Head Circumference --      Peak Flow --      Pain Score 09/25/22 1252 7     Pain Loc --      Pain Edu? --      Excl. in [place]? --    No data found.  Updated Vital Signs BP (!) 147/82 (BP Location: Right Arm)   Pulse 89   Temp 99 F (37.2 C) (Oral)   Resp 18   Ht '5\' 2"'$  (1.575 m)   Wt 83.9 kg   LMP 09/23/2022   SpO2 95%   BMI 33.84 kg/m   Visual Acuity Right Eye Distance:   Left Eye Distance:   Bilateral Distance:    Right Eye Near:   Left Eye Near:    Bilateral Near:     Physical Exam Vitals and nursing note reviewed.  Constitutional:      Appearance: Stephanie Hogan is well-developed.  HENT:     Head: Normocephalic.  Pulmonary:     Effort: Pulmonary effort is normal.  Abdominal:     General: There is no distension.  Musculoskeletal:        General: Normal range of motion.     Cervical back: Normal range of motion.  Neurological:     Mental Status: Stephanie Hogan is alert and  oriented to person, place, and time.      UC Treatments / Results  Labs (all labs ordered are listed, but only abnormal results are displayed) Labs Reviewed  POCT URINALYSIS DIP (MANUAL ENTRY) - Abnormal; Notable for the following components:      Result Value   Clarity, UA turbid (*)    Bilirubin, UA small (*)    Spec Grav, UA >=1.030 (*)    Blood, UA large (*)    Protein Ur, POC >=300 (*)    Leukocytes, UA Trace (*)    All other components within normal limits    EKG   Radiology No results found.  Procedures Procedures (including critical care time)  Medications Ordered in UC Medications - No data to display  Initial Impression / Assessment and Plan / UC Course  I have reviewed the triage vital signs and the nursing notes.  Pertinent labs & imaging results that were available during my care of the patient were reviewed by me and considered in my medical decision making (see chart for details).     DM patient given a prescription for Keflex and Zofran patient is advised to follow-up with her primary care physician Final Clinical Impressions(s) / UC Diagnoses   Final diagnoses:  Acute cystitis without hematuria   Discharge Instructions   None    ED Prescriptions     Medication Sig Dispense Auth. Provider   ondansetron (ZOFRAN) 4 MG tablet Take 1 tablet (4 mg total) by mouth daily as needed for nausea or vomiting. 10 tablet Jacy Brocker K, PA-C   cephALEXin (KEFLEX) 500 MG capsule Take 1 capsule (500 mg total) by mouth 4 (four) times daily for 10 days. 28 capsule Fransico Meadow, [place]      PDMP not reviewed this encounter. An After Visit Summary was printed and given to the patient.       Fransico Meadow, [place] 09/30/22 1353

## 2023-01-03 ENCOUNTER — Ambulatory Visit
Admission: EM | Admit: 2023-01-03 | Discharge: 2023-01-03 | Disposition: A | Discharge status: Home / Self Care | Payer: BC Managed Care – PPO | Attending: Family Medicine | Admitting: Family Medicine

## 2023-01-03 ENCOUNTER — Telehealth: Payer: Self-pay | Admitting: Emergency Medicine

## 2023-01-03 ENCOUNTER — Encounter: Payer: Self-pay | Admitting: Emergency Medicine

## 2023-01-03 DIAGNOSIS — R21 Rash and other nonspecific skin eruption: Secondary | ICD-10-CM

## 2023-01-03 DIAGNOSIS — B372 Candidiasis of skin and nail: Secondary | ICD-10-CM

## 2023-01-03 MED ORDER — CLOTRIMAZOLE-BETAMETHASONE 1-0.05 % EX CREA: TOPICAL_CREAM | CUTANEOUS | 0 refills | Status: DC

## 2023-01-03 MED ORDER — METHYLPREDNISOLONE SODIUM SUCC 125 MG IJ SOLR
40.0000 mg | Freq: Once | INTRAMUSCULAR | Status: AC
  Administered 2023-01-03: 40 mg via INTRAMUSCULAR

## 2023-01-03 MED ORDER — FLUCONAZOLE 150 MG PO TABS: ORAL_TABLET | ORAL | 0 refills | Status: DC

## 2023-01-03 NOTE — Telephone Encounter (Signed)
Call from pt regarding medication - prescribed cream not available at pharmacy. Alternate to be sent in to pharmacy on record if pharmacy can not get med for pt today. Pt to call back

## 2023-01-03 NOTE — Discharge Instructions (Signed)
Take the diflucan tablet once a week for 4 weeks Use the lotrisone cream 2 x a day until clears See your doctor if no improvement in a week

## 2023-01-03 NOTE — ED Provider Notes (Signed)
[facility]    CSN: [id number] Arrival date & time: 01/03/23  8413      History   Chief Complaint Chief Complaint  Patient presents with   Rash    HPI Stephanie Hogan is a 42 y.o. female.   HPI  Rash in both axilla R>L.  Burns and hurts.  Not responsing to nystatin prescribed for under breast rash.  Does not have history sensitive skin or eczema  Past Medical History:  Diagnosis Date   Asthma    Ovarian cyst     Patient Active Problem List   Diagnosis Date Noted   Suspicious nevus 02/05/2022   Squamous blepharitis of both upper and lower eyelid of right eye 07/09/2020   Hematoma 10/11/2018   ETD (Eustachian tube dysfunction), right 06/17/2017   Acute recurrent maxillary sinusitis 06/17/2017   Grief reaction 11/17/2016   ACUTE BRONCHITIS 09/12/2010   URI 09/05/2008   ROM 03/22/2008   ACUTE BRONCHOSPASM 03/15/2008   ENTERITIS, DUE TO VIRAL NEC 04/15/2007   SPRAIN/STRAIN, LUMBAR REGION 02/23/2007    Past Surgical History:  Procedure Laterality Date   CHOLECYSTECTOMY     KNEE SURGERY     MOLE REMOVAL      OB History     Gravida  1   Para  0   Term  0   Preterm  0   AB  0   Living  0      SAB  0   IAB  0   Ectopic  0   Multiple  0   Live Births               Home Medications    Prior to Admission medications   Medication Sig Start Date End Date Taking? Authorizing Provider  clotrimazole-betamethasone (LOTRISONE) cream Apply to affected area 2 times daily prn 01/03/23  Yes Eustace Moore, MD  fluconazole (DIFLUCAN) 150 MG tablet Use once a week for 4 weeks 01/03/23  Yes Eustace Moore, MD  metFORMIN (GLUCOPHAGE) 500 MG tablet Take 500 mg by mouth 2 (two) times daily. 10/11/21  Yes [provider]  Prenatal Vit-Fe Fumarate-FA (PRENATAL VITAMIN PO) Take by mouth.   Yes [provider]  ondansetron (ZOFRAN) 4 MG tablet Take 1 tablet (4 mg total) by mouth daily as needed for nausea or vomiting. 09/25/22  09/25/23  Elson Areas, PA-C    Family History Family History  Problem Relation Age of Onset   Breast cancer Maternal Grandmother    Lung cancer Maternal Grandfather    Alzheimer's disease Paternal Grandfather     Social History Social History   Tobacco Use   Smoking status: Never   Smokeless tobacco: Never  Vaping Use   Vaping Use: Never used  Substance Use Topics   Alcohol use: Yes    Alcohol/week: 2.0 standard drinks of alcohol    Types: 2 Standard drinks or equivalent per week    Comment: Occasional   Drug use: No     Allergies   Patient has no known allergies.   Review of Systems Review of Systems See HPI  Physical Exam Triage Vital Signs ED Triage Vitals  Enc Vitals Group     BP 01/03/23 0949 110/76     Pulse Rate 01/03/23 0949 98     Resp 01/03/23 0949 16     Temp 01/03/23 0949 98.3 F (36.8 C)     Temp src --      SpO2 01/03/23 0949 96 %  Weight 01/03/23 0951 188 lb (85.3 kg)     Height 01/03/23 0951 5\' 2"  (1.575 m)     Head Circumference --      Peak Flow --      Pain Score 01/03/23 0951 7     Pain Loc --      Pain Edu? --      Excl. in [place]? --    No data found.  Updated Vital Signs BP 110/76 (BP Location: Left Arm)   Pulse 98   Temp 98.3 F (36.8 C)   Resp 16   Ht 5\' 2"  (1.575 m)   Wt 85.3 kg   LMP 12/20/2022   SpO2 96%   BMI 34.39 kg/m       Physical Exam Constitutional:      General: She is not in acute distress.    Appearance: She is well-developed. She is ill-appearing.     Comments: uncomfortable  HENT:     Head: Normocephalic and atraumatic.  Eyes:     Conjunctiva/sclera: Conjunctivae normal.     Pupils: Pupils are equal, round, and reactive to light.  Cardiovascular:     Rate and Rhythm: Normal rate.  Pulmonary:     Effort: Pulmonary effort is normal. No respiratory distress.  Abdominal:     General: There is no distension.     Palpations: Abdomen is soft.  Musculoskeletal:        General: Normal range of  motion.     Cervical back: Normal range of motion.  Skin:    General: Skin is warm and dry.     Findings: Rash present.     Comments: Right axilla is covered in confluent angry red rash consisting of papules, there are satellite lesions around periphery.  No vesicles or drainage  Neurological:     Mental Status: She is alert.      UC Treatments / Results  Labs (all labs ordered are listed, but only abnormal results are displayed) Labs Reviewed - No data to display  EKG   Radiology No results found.  Procedures Procedures (including critical care time)  Medications Ordered in UC Medications  methylPREDNISolone sodium succinate (SOLU-MEDROL) 125 mg/2 mL injection 40 mg (has no administration in time range)    Initial Impression / Assessment and Plan / UC Course  I have reviewed the triage vital signs and the nursing notes.  Pertinent labs & imaging results that were available during my care of the patient were reviewed by me and considered in my medical decision making (see chart for details).     Discussed keeping area dry Cream use and tablets  Final Clinical Impressions(s) / UC Diagnoses   Final diagnoses:  Rash and nonspecific skin eruption  Candidal intertrigo     Discharge Instructions      Take the diflucan tablet once a week for 4 weeks Use the lotrisone cream 2 x a day until clears See your doctor if no improvement in a week   ED Prescriptions     Medication Sig Dispense Auth. Provider   fluconazole (DIFLUCAN) 150 MG tablet Use once a week for 4 weeks 4 tablet Eustace Moore, MD   clotrimazole-betamethasone (LOTRISONE) cream Apply to affected area 2 times daily prn 15 g Eustace Moore, MD      PDMP not reviewed this encounter.   Eustace Moore, MD 01/03/23 1017

## 2023-01-03 NOTE — ED Triage Notes (Signed)
Patient c/o bilateral axilla rash worse under right armpit x 2 days.  The area is red and burning.  Patient has applied Nystatin cream to the area which has worsened the rash.  Patient did tried a new deodorant due to deodorant not working.

## 2023-01-12 ENCOUNTER — Encounter: Payer: Self-pay | Admitting: Medical-Surgical

## 2023-01-12 ENCOUNTER — Ambulatory Visit (INDEPENDENT_AMBULATORY_CARE_PROVIDER_SITE_OTHER): Payer: BC Managed Care – PPO | Admitting: Medical-Surgical

## 2023-01-12 VITALS — BP 132/81 | HR 114 | Resp 20 | Ht 62.0 in | Wt 193.1 lb

## 2023-01-12 DIAGNOSIS — J209 Acute bronchitis, unspecified: Secondary | ICD-10-CM | POA: Diagnosis not present

## 2023-01-12 DIAGNOSIS — H6501 Acute serous otitis media, right ear: Secondary | ICD-10-CM | POA: Diagnosis not present

## 2023-01-12 MED ORDER — HYDROCOD POLI-CHLORPHE POLI ER 10-8 MG/5ML PO SUER: 5.0000 mL | Freq: Two times a day (BID) | ORAL | 0 refills | Status: DC | PRN

## 2023-01-12 MED ORDER — AMOXICILLIN-POT CLAVULANATE 875-125 MG PO TABS: 1.0000 | ORAL_TABLET | Freq: Two times a day (BID) | ORAL | 0 refills | Status: DC

## 2023-01-12 MED ORDER — METHYLPREDNISOLONE 4 MG PO TBPK: ORAL_TABLET | ORAL | 0 refills | Status: DC

## 2023-01-12 NOTE — Progress Notes (Signed)
        Established patient visit  History, exam, impression, and plan:  1. Non-recurrent acute serous otitis media of right ear 2. Acute bronchitis, unspecified organism Pleasant 42 year old female presenting today with reports of 5-6 days of severe coughing.  Notes that she has a cough during this time of year related to allergies and throughout most of the summer however on Friday night, she went to the [place] was surrounded by lots of smokers.  After that, her cough increased in severity and she now has chest tightness.  Has coughed up a small amount of mucus over the last few days.  This morning, she tried to blow her nose and reports that her right ear popped painfully.  It now feels very clogged and has a lot of pressure.  Denies fever, chills, rhinorrhea, severe sinus congestion, chest pain, and GI symptoms.  No recent exposures to known sick contacts.  Taking as needed allergy medication and has been using Mucinex with minimal relief.  On exam, HRR, S1/S2 normal.  Scattered rhonchi to the right upper lobe posteriorly, otherwise lungs CTA.  Respirations even and unlabored.  No sinus tenderness.  Mild posterior oropharyngeal erythema with no exudate.  No cervical lymphadenopathy.  Left external ear canal and TM normal.  Right external ear canal normal however TM is erythematous with serous effusion.  Treating with Augmentin for otitis media.  Adding Medrol Dosepak for bronchitis.  Sending Tussionex for cough as Tessalon Perles have been ineffective in the past.  Procedures performed this visit: None.  Return if symptoms worsen or fail to improve.  __________________________________ Thayer Ohm, DNP, APRN, FNP-BC Primary Care and Sports Medicine [facility]

## 2023-01-13 MED ORDER — HYDROCOD POLI-CHLORPHE POLI ER 10-8 MG/5ML PO SUER: 5.0000 mL | Freq: Two times a day (BID) | ORAL | 0 refills | Status: DC | PRN

## 2023-01-13 NOTE — Telephone Encounter (Signed)
Cancelled prescription.  

## 2023-01-18 ENCOUNTER — Ambulatory Visit: Payer: BC Managed Care – PPO | Admitting: Family Medicine

## 2023-01-18 ENCOUNTER — Encounter: Payer: Self-pay | Admitting: Family Medicine

## 2023-01-18 VITALS — BP 119/84 | HR 91 | Ht 62.0 in | Wt 191.0 lb

## 2023-01-18 DIAGNOSIS — H6690 Otitis media, unspecified, unspecified ear: Secondary | ICD-10-CM

## 2023-01-18 DIAGNOSIS — S335XXA Sprain of ligaments of lumbar spine, initial encounter: Secondary | ICD-10-CM | POA: Diagnosis not present

## 2023-01-18 MED ORDER — CEFDINIR 300 MG PO CAPS: 300.0000 mg | ORAL_CAPSULE | Freq: Two times a day (BID) | ORAL | 0 refills | Status: DC

## 2023-01-18 MED ORDER — MELOXICAM 7.5 MG PO TABS: 7.5000 mg | ORAL_TABLET | Freq: Every day | ORAL | 0 refills | Status: DC

## 2023-01-18 MED ORDER — TIZANIDINE HCL 4 MG PO TABS: 4.0000 mg | ORAL_TABLET | Freq: Four times a day (QID) | ORAL | 0 refills | Status: DC | PRN

## 2023-01-18 NOTE — Patient Instructions (Signed)
Start cefdinir for ear infection  Meloxicam and tizanidine as needed for back.  Let me know if either of these are worsening.

## 2023-01-18 NOTE — Assessment & Plan Note (Signed)
She recently completed course of Augmentin.  Continues to have signs and symptoms of infection.  Adding course of cefdinir.  Recommend stay well-hydrated.  Flonase may be helpful as well to help with eustachian tube dysfunction.

## 2023-01-18 NOTE — Assessment & Plan Note (Signed)
Recommend icing when she returns home.  Adding short-term meloxicam with tizanidine as needed.  Red flag symptoms discussed.  Encouraged to follow-up if not improving.

## 2023-01-18 NOTE — Progress Notes (Signed)
Stephanie Hogan - 43 y.o. female MRN [id number]  Date of birth: [birth year]  Subjective Chief Complaint  Patient presents with   Ear Pain    HPI Stephanie Hogan is a 42 year old female here today for follow-up of ear pain.  She was seen by Joy on 01/12/2023.  Noted to have otitis media.  Started on Augmentin.  She has completed this course but continues to have right-sided ear pain.  Has popping and pressure associated with the pain.  She did complete a course of steroids as well.  She denies new fever or chills.  She also strained her back today at work.  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  She did take some ibuprofen with some relief.  Has radiation into the lower extremities, numbness or tingling.  ROS:  A comprehensive ROS was completed and negative except as noted per HPI   No Known Allergies  Past Medical History:  Diagnosis Date   Asthma    Ovarian cyst     Past Surgical History:  Procedure Laterality Date   CHOLECYSTECTOMY     KNEE SURGERY     MOLE REMOVAL      Social History   Socioeconomic History   Marital status: Married    Spouse name: Not on file   Number of children: Not on file   Years of education: Not on file   Highest education level: Not on file  Occupational History   Not on file  Tobacco Use   Smoking status: Never   Smokeless tobacco: Never  Vaping Use   Vaping Use: Never used  Substance and Sexual Activity   Alcohol use: Yes    Alcohol/week: 2.0 standard drinks of alcohol    Types: 2 Standard drinks or equivalent per week    Comment: Occasional   Drug use: No   Sexual activity: Yes    Birth control/protection: None  Other Topics Concern   Not on file  Social History Narrative   Not on file   Social Determinants of Health   Financial Resource Strain: Not on file  Food Insecurity: Not on file  Transportation Needs: Not on file  Physical Activity: Not on file  Stress: Not on file  Social  Connections: Not on file    Family History  Problem Relation Age of Onset   Breast cancer Maternal Grandmother    Lung cancer Maternal Grandfather    Alzheimer's disease Paternal Grandfather     Health Maintenance  Topic Date Due   HIV Screening  Never done   Hepatitis C Screening  Never done   COVID-19 Vaccine (3 - 2023-24 season) 01/28/2023 (Originally 05/08/2022)   INFLUENZA VACCINE  04/08/2023   PAP SMEAR-Modifier  10/27/2023   DTaP/Tdap/Td (2 - Td or Tdap) 05/15/2024   HPV VACCINES  Aged Out     ----------------------------------------------------------------------------------------------------------------------------------------------------------------------------------------------------------------- Physical Exam BP 119/84 (BP Location: Right Arm, Patient Position: Sitting, Cuff Size: Normal)   Pulse 91   Ht 5\' 2"  (1.575 m)   Wt 191 lb (86.6 kg)   LMP 12/20/2022   SpO2 97%   BMI 34.93 kg/m   Physical Exam Constitutional:      Appearance: Normal appearance.  HENT:     Head: Normocephalic and atraumatic.     Ears:     Comments: Right TM is erythematous Eyes:     General: No scleral icterus. Cardiovascular:     Rate and Rhythm: Normal rate and  regular rhythm.  Pulmonary:     Effort: Pulmonary effort is normal.     Breath sounds: Normal breath sounds.  Musculoskeletal:     Cervical back: Neck supple.  Neurological:     General: No focal deficit present.     Mental Status: She is alert.  Psychiatric:        Mood and Affect: Mood normal.        Behavior: Behavior normal.     ------------------------------------------------------------------------------------------------------------------------------------------------------------------------------------------------------------------- Assessment and Plan  Otitis media She recently completed course of Augmentin.  Continues to have signs and symptoms of infection.  Adding course of cefdinir.  Recommend stay  well-hydrated.  Flonase may be helpful as well to help with eustachian tube dysfunction.  SPRAIN/STRAIN, LUMBAR REGION Recommend icing when she returns home.  Adding short-term meloxicam with tizanidine as needed.  Red flag symptoms discussed.  Encouraged to follow-up if not improving.   Meds ordered this encounter  Medications   tiZANidine (ZANAFLEX) 4 MG tablet    Sig: Take 1 tablet (4 mg total) by mouth every 6 (six) hours as needed for muscle spasms.    Dispense:  30 tablet    Refill:  0   meloxicam (MOBIC) 7.5 MG tablet    Sig: Take 1-2 tablets (7.5-15 mg total) by mouth daily.    Dispense:  30 tablet    Refill:  0   cefdinir (OMNICEF) 300 MG capsule    Sig: Take 1 capsule (300 mg total) by mouth 2 (two) times daily.    Dispense:  20 capsule    Refill:  0    No follow-ups on file.    This visit occurred during the SARS-CoV-2 public health emergency.  Safety protocols were in place, including screening questions prior to the visit, additional usage of staff PPE, and extensive cleaning of exam room while observing appropriate contact time as indicated for disinfecting solutions.

## 2023-03-07 ENCOUNTER — Ambulatory Visit
Admission: EM | Admit: 2023-03-07 | Discharge: 2023-03-07 | Disposition: A | Discharge status: Home / Self Care | Payer: BC Managed Care – PPO | Attending: Family Medicine | Admitting: Family Medicine

## 2023-03-07 ENCOUNTER — Other Ambulatory Visit: Payer: Self-pay

## 2023-03-07 DIAGNOSIS — S39012A Strain of muscle, fascia and tendon of lower back, initial encounter: Secondary | ICD-10-CM | POA: Diagnosis not present

## 2023-03-07 DIAGNOSIS — R300 Dysuria: Secondary | ICD-10-CM

## 2023-03-07 LAB — POCT URINALYSIS DIP (MANUAL ENTRY)
Bilirubin, UA: NEGATIVE
Glucose, UA: NEGATIVE mg/dL
Ketones, POC UA: NEGATIVE mg/dL
Leukocytes, UA: NEGATIVE
Nitrite, UA: NEGATIVE
Protein Ur, POC: 30 mg/dL — AB
Spec Grav, UA: 1.02 (ref 1.010–1.025)
Urobilinogen, UA: 0.2 E.U./dL
pH, UA: 6 (ref 5.0–8.0)

## 2023-03-07 MED ORDER — IBUPROFEN 800 MG PO TABS: 800.0000 mg | ORAL_TABLET | Freq: Every day | ORAL | 0 refills | Status: DC | PRN

## 2023-03-07 NOTE — ED Triage Notes (Signed)
Pt complains of lower back pain and urinary discomfort onset since Friday.

## 2023-03-07 NOTE — Discharge Instructions (Addendum)
Advised patient UA was unremarkable and urine culture would not be ordered.  Advised patient may take Ibuprofen daily or as needed for lumbar strain.  Encouraged to increase daily water intake while taking these medication.  Advised if symptoms worsen and/or unresolved please follow-up with PCP or here for further evaluation.

## 2023-03-07 NOTE — ED Provider Notes (Signed)
[name]    CSN: [id number] Arrival date & time: 03/07/23  0916      History   Chief Complaint Chief Complaint  Patient presents with   Back Pain    Urinary symptoms    HPI Stephanie Hogan is a 42 y.o. female.   HPI 42 year old female presents with lower back pain and urinary discomfort for 2 days.  Patient reports slipping and injuring lower [place] beach and may be muscle sprain/strain of lower back.  PMH significant for ovarian cyst.  Past Medical History:  Diagnosis Date   Asthma    Ovarian cyst     Patient Active Problem List   Diagnosis Date Noted   Suspicious nevus 02/05/2022   Squamous blepharitis of both upper and lower eyelid of right eye 07/09/2020   Hematoma 10/11/2018   ETD (Eustachian tube dysfunction), right 06/17/2017   Acute recurrent maxillary sinusitis 06/17/2017   Grief reaction 11/17/2016   Elevated lipase 01/17/2014   ACUTE BRONCHITIS 09/12/2010   URI 09/05/2008   Otitis media 03/22/2008   ACUTE BRONCHOSPASM 03/15/2008   ENTERITIS, DUE TO VIRAL NEC 04/15/2007   SPRAIN/STRAIN, LUMBAR REGION 02/23/2007    Past Surgical History:  Procedure Laterality Date   CHOLECYSTECTOMY     KNEE SURGERY     MOLE REMOVAL      OB History     Gravida  1   Para  0   Term  0   Preterm  0   AB  0   Living  0      SAB  0   IAB  0   Ectopic  0   Multiple  0   Live Births               Home Medications    Prior to Admission medications   Medication Sig Start Date End Date Taking? Authorizing Provider  ibuprofen (ADVIL) 800 MG tablet Take 1 tablet (800 mg total) by mouth daily as needed. 03/07/23  Yes Trevor Iha, FNP  cefdinir (OMNICEF) 300 MG capsule Take 1 capsule (300 mg total) by mouth 2 (two) times daily. 01/18/23   Everrett Coombe, DO  chlorpheniramine-HYDROcodone (TUSSIONEX) 10-8 MG/5ML Take 5 mLs by mouth every 12 (twelve) hours as needed for cough (cough, will cause drowsiness.). 01/13/23   Christen Butter, NP   clotrimazole-betamethasone (LOTRISONE) cream Apply to affected area 2 times daily prn 01/03/23   Eustace Moore, MD  fluconazole (DIFLUCAN) 150 MG tablet Use once a week for 4 weeks 01/03/23   Eustace Moore, MD  meloxicam (MOBIC) 7.5 MG tablet Take 1-2 tablets (7.5-15 mg total) by mouth daily. 01/18/23   Everrett Coombe, DO  metFORMIN (GLUCOPHAGE) 500 MG tablet Take 500 mg by mouth 2 (two) times daily. 10/11/21   [provider]  methylPREDNISolone (MEDROL DOSEPAK) 4 MG TBPK tablet 6-day pack as directed 01/12/23   Christen Butter, NP  ondansetron (ZOFRAN) 4 MG tablet Take 1 tablet (4 mg total) by mouth daily as needed for nausea or vomiting. 09/25/22 09/25/23  Elson Areas, PA-C  Prenatal Vit-Fe Fumarate-FA (PRENATAL VITAMIN PO) Take by mouth.    [provider]  tiZANidine (ZANAFLEX) 4 MG tablet Take 1 tablet (4 mg total) by mouth every 6 (six) hours as needed for muscle spasms. 01/18/23   Everrett Coombe, DO    Family History Family History  Problem Relation Age of Onset   Breast cancer Maternal Grandmother    Lung cancer Maternal Grandfather  Alzheimer's disease Paternal Grandfather     Social History Social History   Tobacco Use   Smoking status: Never   Smokeless tobacco: Never  Vaping Use   Vaping Use: Never used  Substance Use Topics   Alcohol use: Yes    Alcohol/week: 2.0 standard drinks of alcohol    Types: 2 Standard drinks or equivalent per week    Comment: Occasional   Drug use: No     Allergies   Patient has no known allergies.   Review of Systems Review of Systems  Musculoskeletal:  Positive for back pain.     Physical Exam Triage Vital Signs ED Triage Vitals  Enc Vitals Group     BP 03/07/23 0948 (!) 132/92     Pulse Rate 03/07/23 0946 100     Resp 03/07/23 0946 16     Temp 03/07/23 0946 98.3 F (36.8 C)     Temp Source 03/07/23 0946 Oral     SpO2 03/07/23 0946 97 %     Weight --      Height --      Head Circumference --       Peak Flow --      Pain Score 03/07/23 0948 6     Pain Loc --      Pain Edu? --      Excl. in GC? --    No data found.  Updated Vital Signs BP (!) 132/92   Pulse 100   Temp 98.3 F (36.8 C) (Oral)   Resp 16   Wt 188 lb (85.3 kg)   SpO2 97%   BMI 34.39 kg/m       Physical Exam Vitals and nursing note reviewed.  Constitutional:      Appearance: Normal appearance. She is obese.  HENT:     Head: Normocephalic and atraumatic.     Mouth/Throat:     Mouth: Mucous membranes are moist.     Pharynx: Oropharynx is clear.  Eyes:     Extraocular Movements: Extraocular movements intact.     Conjunctiva/sclera: Conjunctivae normal.     Pupils: Pupils are equal, round, and reactive to light.  Cardiovascular:     Rate and Rhythm: Normal rate and regular rhythm.     Pulses: Normal pulses.     Heart sounds: Normal heart sounds.  Pulmonary:     Effort: Pulmonary effort is normal.     Breath sounds: Normal breath sounds. No wheezing, rhonchi or rales.  Musculoskeletal:        General: Normal range of motion.     Cervical back: Normal range of motion and neck supple.  Skin:    General: Skin is warm and dry.  Neurological:     General: No focal deficit present.     Mental Status: She is alert and oriented to person, place, and time. Mental status is at baseline.  Psychiatric:        Mood and Affect: Mood normal.        Behavior: Behavior normal.      UC Treatments / Results  Labs (all labs ordered are listed, but only abnormal results are displayed) Labs Reviewed  POCT URINALYSIS DIP (MANUAL ENTRY) - Abnormal; Notable for the following components:      Result Value   Clarity, UA cloudy (*)    Blood, UA small (*)    Protein Ur, POC =30 (*)    All other components within normal limits    EKG   Radiology  No results found.  Procedures Procedures (including critical care time)  Medications Ordered in UC Medications - No data to display  Initial Impression /  Assessment and Plan / UC Course  I have reviewed the triage vital signs and the nursing notes.  Pertinent labs & imaging results that were available during my care of the patient were reviewed by me and considered in my medical decision making (see chart for details).     MDM: 1.  Strain of lumbar region, initial encounter-Rx'd Ibuprofen 800 mg daily or as needed; 2.  Dysuria-UA revealed above. Advised patient UA was unremarkable and urine culture would not be ordered.  Advised patient may take Ibuprofen daily or as needed for lumbar strain.  Encouraged to increase daily water intake while taking these medication.  Advised if symptoms worsen and/or unresolved please follow-up with PCP or here for further evaluation.  Patient discharged home, hemodynamically stable. Final Clinical Impressions(s) / UC Diagnoses   Final diagnoses:  Dysuria  Strain of lumbar region, initial encounter     Discharge Instructions      Advised patient UA was unremarkable and urine culture would not be ordered.  Advised patient may take Ibuprofen daily or as needed for lumbar strain.  Encouraged to increase daily water intake while taking these medication.  Advised if symptoms worsen and/or unresolved please follow-up with PCP or here for further evaluation.     ED Prescriptions     Medication Sig Dispense Auth. Provider   ibuprofen (ADVIL) 800 MG tablet Take 1 tablet (800 mg total) by mouth daily as needed. 21 tablet Trevor Iha, FNP      PDMP not reviewed this encounter.   Trevor Iha, FNP 03/07/23 1146

## 2023-03-09 ENCOUNTER — Telehealth: Payer: BC Managed Care – PPO | Admitting: Physician Assistant

## 2023-03-09 DIAGNOSIS — R8290 Unspecified abnormal findings in urine: Secondary | ICD-10-CM

## 2023-03-09 NOTE — Progress Notes (Signed)
Virtual Visit Consent   Stephanie Hogan, you are scheduled for a virtual visit with a [name] provider today. Just as with appointments in the office, your consent must be obtained to participate. Your consent will be active for this visit and any virtual visit you may have with one of our providers in the next 365 days. If you have a MyChart account, a copy of this consent can be sent to you electronically.  As this is a virtual visit, video technology does not allow for your provider to perform a traditional examination. This may limit your provider's ability to fully assess your condition. If your provider identifies any concerns that need to be evaluated in person or the need to arrange testing (such as labs, EKG, etc.), we will make arrangements to do so. Although advances in technology are sophisticated, we cannot ensure that it will always work on either your end or our end. If the connection with a video visit is poor, the visit may have to be switched to a telephone visit. With either a video or telephone visit, we are not always able to ensure that we have a secure connection.  By engaging in this virtual visit, you consent to the provision of healthcare and authorize for your insurance to be billed (if applicable) for the services provided during this visit. Depending on your insurance coverage, you may receive a charge related to this service.  I need to obtain your verbal consent now. Are you willing to proceed with your visit today? Stephanie Hogan has provided verbal consent on 03/09/2023 for a virtual visit (video or telephone). Stephanie Hogan, [place]  Date: 03/09/2023 7:05 PM  Virtual Visit via Video Note   I, Stephanie Hogan, connected with  Azion Jalbert  ([id number], Jul 03, 1981) on 03/09/23 at  7:00 PM EDT by a video-enabled telemedicine application and verified that I am speaking with the correct person using two identifiers.  Location: Patient: Virtual Visit Location Patient:  Home Provider: Virtual Visit Location Provider: Home Office   I discussed the limitations of evaluation and management by telemedicine and the availability of in person appointments. The patient expressed understanding and agreed to proceed.    History of Present Illness: Stephanie Hogan is a 42 y.o. who identifies as a female who was assigned female at birth, and is being seen today for follow-up from her UC visit on 6/30 for back pain. Notes she was evaluated for low back pain and told likely MSK. Followed providers instructions with improvement in symptoms, almost completely resolved. Noted on her MyChart that there was trace protein and blood in her urine dipstick and wanted to make sure nothing else needed as she was told her test was normal. Denies any urinary frequency, urgency, hesitancy, dysuria, abdominal pain, nausea, etc.   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   Squamous blepharitis of both upper and lower eyelid of right eye 07/09/2020   Hematoma 10/11/2018   ETD (Eustachian tube dysfunction), right 06/17/2017   Acute recurrent maxillary sinusitis 06/17/2017   Grief reaction 11/17/2016   Elevated lipase 01/17/2014   ACUTE BRONCHITIS 09/12/2010   URI 09/05/2008   Otitis media 03/22/2008   ACUTE BRONCHOSPASM 03/15/2008   ENTERITIS, DUE TO VIRAL NEC 04/15/2007   SPRAIN/STRAIN, LUMBAR REGION 02/23/2007    Allergies: No Known Allergies Medications:  Current Outpatient Medications:    cefdinir (OMNICEF) 300 MG capsule, Take 1 capsule (300 mg  total) by mouth 2 (two) times daily., Disp: 20 capsule, Rfl: 0   chlorpheniramine-HYDROcodone (TUSSIONEX) 10-8 MG/5ML, Take 5 mLs by mouth every 12 (twelve) hours as needed for cough (cough, will cause drowsiness.)., Disp: 120 mL, Rfl: 0   clotrimazole-betamethasone (LOTRISONE) cream, Apply to affected area 2 times daily prn, Disp: 15 g, Rfl: 0   fluconazole (DIFLUCAN) 150 MG  tablet, Use once a week for 4 weeks, Disp: 4 tablet, Rfl: 0   ibuprofen (ADVIL) 800 MG tablet, Take 1 tablet (800 mg total) by mouth daily as needed., Disp: 21 tablet, Rfl: 0   meloxicam (MOBIC) 7.5 MG tablet, Take 1-2 tablets (7.5-15 mg total) by mouth daily., Disp: 30 tablet, Rfl: 0   metFORMIN (GLUCOPHAGE) 500 MG tablet, Take 500 mg by mouth 2 (two) times daily., Disp: , Rfl:    methylPREDNISolone (MEDROL DOSEPAK) 4 MG TBPK tablet, 6-day pack as directed, Disp: 21 tablet, Rfl: 0   ondansetron (ZOFRAN) 4 MG tablet, Take 1 tablet (4 mg total) by mouth daily as needed for nausea or vomiting., Disp: 10 tablet, Rfl: 0   Prenatal Vit-Fe Fumarate-FA (PRENATAL VITAMIN PO), Take by mouth., Disp: , Rfl:    tiZANidine (ZANAFLEX) 4 MG tablet, Take 1 tablet (4 mg total) by mouth every 6 (six) hours as needed for muscle spasms., Disp: 30 tablet, Rfl: 0  Observations/Objective: Patient is well-developed, well-nourished in no acute distress.  Resting comfortably  at home.  Head is normocephalic, atraumatic.  No labored breathing.  Speech is clear and coherent with logical content.  Patient is alert and oriented at baseline.   Assessment and Plan: 1. Abnormal urinalysis  Back pain is resolving so further supports UC provider diagnosis of MSK etiology. Continue their directions. Urine dipstick was done only -- no micro or culture. Unremarkable for signs of infection. Trace protein and positive blood but no microscopy done. On prior UA she has had some level os proteinuria, more substantial with prior UA. Discussed with absence of symptoms, no change in treatment needed. Recommend PCP follow-up in next couple of weeks for repeat UA with micro to make sure no concern for true microscopic hematuria and to further assess proteinuria.   Follow Up Instructions: I discussed the assessment and treatment plan with the patient. The patient was provided an opportunity to ask questions and all were answered. The patient  agreed with the plan and demonstrated an understanding of the instructions.  A copy of instructions were sent to the patient via MyChart unless otherwise noted below.   The patient was advised to call back or seek an in-person evaluation if the symptoms worsen or if the condition fails to improve as anticipated.  Time:  I spent 10 minutes with the patient via telehealth technology discussing the above problems/concerns.    Stephanie Climes, PA-C

## 2023-03-09 NOTE — Patient Instructions (Signed)
  [name], thank you for joining [name], PA-C for today's virtual visit.  While this provider is not your primary care provider (PCP), if your PCP is located in our provider database this encounter information will be shared with them immediately following your visit.   A [facility] MyChart account gives you access to today's visit and all your visits, tests, and labs performed at [facility] " click here if you don't have a [facility] MyChart account or go to [email or web address]  Consent: (Patient) Stephanie Hogan provided verbal consent for this virtual visit at the beginning of the encounter.  Current Medications:  Current Outpatient Medications:    cefdinir (OMNICEF) 300 MG capsule, Take 1 capsule (300 mg total) by mouth 2 (two) times daily., Disp: 20 capsule, Rfl: 0   chlorpheniramine-HYDROcodone (TUSSIONEX) 10-8 MG/5ML, Take 5 mLs by mouth every 12 (twelve) hours as needed for cough (cough, will cause drowsiness.)., Disp: 120 mL, Rfl: 0   clotrimazole-betamethasone (LOTRISONE) cream, Apply to affected area 2 times daily prn, Disp: 15 g, Rfl: 0   fluconazole (DIFLUCAN) 150 MG tablet, Use once a week for 4 weeks, Disp: 4 tablet, Rfl: 0   ibuprofen (ADVIL) 800 MG tablet, Take 1 tablet (800 mg total) by mouth daily as needed., Disp: 21 tablet, Rfl: 0   meloxicam (MOBIC) 7.5 MG tablet, Take 1-2 tablets (7.5-15 mg total) by mouth daily., Disp: 30 tablet, Rfl: 0   metFORMIN (GLUCOPHAGE) 500 MG tablet, Take 500 mg by mouth 2 (two) times daily., Disp: , Rfl:    methylPREDNISolone (MEDROL DOSEPAK) 4 MG TBPK tablet, 6-day pack as directed, Disp: 21 tablet, Rfl: 0   ondansetron (ZOFRAN) 4 MG tablet, Take 1 tablet (4 mg total) by mouth daily as needed for nausea or vomiting., Disp: 10 tablet, Rfl: 0   Prenatal Vit-Fe Fumarate-FA (PRENATAL VITAMIN PO), Take by mouth., Disp: , Rfl:    tiZANidine (ZANAFLEX) 4 MG tablet, Take 1 tablet (4 mg total) by mouth every 6 (six) hours  as needed for muscle spasms., Disp: 30 tablet, Rfl: 0   Medications ordered in this encounter:  No orders of the defined types were placed in this encounter.    *If you need refills on other medications prior to your next appointment, please contact your pharmacy*  Follow-Up: Call back or seek an in-person evaluation if the symptoms worsen or if the condition fails to improve as anticipated.  [facility] [phone]  Other Instructions As discussed, keep hydrating and continue the UC provider's recommendations.  Follow-up with your PCP in the next few weeks for a repeat urinalysis and discussion of ongoing levels of protein in the urine.    If you have been instructed to have an in-person evaluation today at a local Urgent Care facility, please use the link below. It will take you to a list of all of our available [facility], including address, phone number and hours of operation. Please do not delay care.  [facility]  If you or a family member do not have a primary care provider, use the link below to schedule a visit and establish care. When you choose a [facility] primary care physician or advanced practice provider, you gain a long-term partner in health. Find a Primary Care Provider  Learn more about [facility]'s in-office and virtual care options:  - Get Care Now

## 2023-03-25 ENCOUNTER — Encounter: Payer: Self-pay | Admitting: Family Medicine

## 2023-03-25 ENCOUNTER — Ambulatory Visit: Payer: BC Managed Care – PPO | Admitting: Family Medicine

## 2023-03-25 VITALS — BP 116/63 | HR 122 | Ht 62.0 in | Wt 188.0 lb

## 2023-03-25 DIAGNOSIS — R110 Nausea: Secondary | ICD-10-CM

## 2023-03-25 DIAGNOSIS — R3129 Other microscopic hematuria: Secondary | ICD-10-CM | POA: Diagnosis not present

## 2023-03-25 DIAGNOSIS — R109 Unspecified abdominal pain: Secondary | ICD-10-CM

## 2023-03-25 LAB — POCT URINALYSIS DIP (CLINITEK)
Glucose, UA: NEGATIVE mg/dL
Ketones, POC UA: NEGATIVE mg/dL
Nitrite, UA: NEGATIVE
POC PROTEIN,UA: 100 — AB
Spec Grav, UA: 1.015 (ref 1.010–1.025)
Urobilinogen, UA: 1 E.U./dL
pH, UA: 6.5 (ref 5.0–8.0)

## 2023-03-25 LAB — POCT URINE PREGNANCY: Preg Test, Ur: NEGATIVE

## 2023-03-25 MED ORDER — AMOXICILLIN-POT CLAVULANATE 875-125 MG PO TABS: 1.0000 | ORAL_TABLET | Freq: Two times a day (BID) | ORAL | 0 refills | Status: DC

## 2023-03-25 NOTE — Progress Notes (Signed)
Acute Office Visit  Subjective:     Patient ID: Stephanie Hogan, female    DOB: 09-May-1981, 42 y.o.   MRN: [id number]  Chief Complaint  Patient presents with   Urinary Tract Infection    Sxs x 1 day. She reports nausea and side/flank pain.     HPI Patient is in today for side and pelvic pain x 1 days.  Also some nausea.  She says this is how she feels when she gets a UTI she can sometimes get really sick to the point of nausea and vomiting.  No dysuria or gross hematuria.  She did go to urgent care a little over 2 weeks ago for symptoms and urinalysis was negative for leukocytes and nitrites but did show protein and blood.  Did not send a culture at that time.  Which also needs additional workup.  She is trying to get pregnant in the next few months and hopes she will be scheduled for an IUI later at the end of this month.  ROS      Objective:    BP 116/63   Pulse (!) 122   Ht 5\' 2"  (1.575 m)   Wt 188 lb (85.3 kg)   LMP 03/17/2023 (Exact Date)   SpO2 96%   BMI 34.39 kg/m    Physical Exam Vitals and nursing note reviewed.  Constitutional:      Appearance: She is well-developed.  HENT:     Head: Normocephalic and atraumatic.  Cardiovascular:     Rate and Rhythm: Normal rate and regular rhythm.     Heart sounds: Normal heart sounds.  Pulmonary:     Effort: Pulmonary effort is normal.     Breath sounds: Normal breath sounds.  Abdominal:     General: Bowel sounds are normal. There is no distension.     Palpations: Abdomen is soft.     Tenderness: There is no abdominal tenderness.  Skin:    General: Skin is warm and dry.  Neurological:     Mental Status: She is alert and oriented to person, place, and time.  Psychiatric:        Behavior: Behavior normal.     Results for orders placed or performed in visit on 03/25/23  POCT URINALYSIS DIP (CLINITEK)  Result Value Ref Range   Color, UA straw (A) yellow   Clarity, UA clear clear   Glucose, UA negative negative  mg/dL   Bilirubin, UA small (A) negative   Ketones, POC UA negative negative mg/dL   Spec Grav, UA 8.413 2.440 - 1.025   Blood, UA moderate (A) negative   pH, UA 6.5 5.0 - 8.0   POC PROTEIN,UA =100 (A) negative, trace   Urobilinogen, UA 1.0 0.2 or 1.0 E.U./dL   Nitrite, UA Negative Negative   Leukocytes, UA Small (1+) (A) Negative  POCT urine pregnancy  Result Value Ref Range   Preg Test, Ur Negative Negative        Assessment & Plan:   Problem List Items Addressed This Visit   None Visit Diagnoses     Flank pain    -  Primary   Relevant Orders   POCT URINALYSIS DIP (CLINITEK) (Completed)   Urine Culture   Urinalysis, microscopic only   Microalbumin, urine   POCT urine pregnancy (Completed)   Nausea       Relevant Orders   POCT urine pregnancy (Completed)   Microscopic hematuria           Flank pain-possible  UTI.  Just shows some trace leukocytes so we discussed that it means she might have a possible UTI.  I am going to go ahead and treat with Augmentin just because she is try to get pregnant.  We did do a urine pregnancy here today in the office and it was negative.  Will send a culture to confirm.  We did discuss that she will need additional workup for that left-sided abdominal pain and flank pain if the urine culture is negative.  She also has some microscopic hematuria so we will send it for microscopic analysis.  She also has some proteinuria showing up on the dipstick so we will send for microalbuminuria as well.  The protein in the blood were both there 2 weeks ago so if it is persistent she will need additional workup.  Meds ordered this encounter  Medications   amoxicillin-clavulanate (AUGMENTIN) 875-125 MG tablet    Sig: Take 1 tablet by mouth 2 (two) times daily.    Dispense:  6 tablet    Refill:  0    I spent 20 minutes on the day of the encounter to include pre-visit record review, face-to-face time with the patient and post visit ordering of  test.   No follow-ups on file.  Nani Gasser, MD

## 2023-03-26 LAB — URINALYSIS, MICROSCOPIC ONLY

## 2023-03-27 ENCOUNTER — Telehealth: Payer: BC Managed Care – PPO | Admitting: Nurse Practitioner

## 2023-03-27 DIAGNOSIS — N3000 Acute cystitis without hematuria: Secondary | ICD-10-CM

## 2023-03-27 LAB — URINALYSIS, MICROSCOPIC ONLY: Hyaline Cast: NONE SEEN /LPF

## 2023-03-27 LAB — URINE CULTURE
MICRO NUMBER:: 15217724
SPECIMEN QUALITY:: ADEQUATE

## 2023-03-27 LAB — MICROALBUMIN, URINE: Microalb, Ur: 39.3 mg/dL

## 2023-03-27 NOTE — Patient Instructions (Signed)
  Stephanie Hogan, thank you for joining Stephanie Pierini, FNP for today's virtual visit.  While this provider is not your primary care provider (PCP), if your PCP is located in our provider database this encounter information will be shared with them immediately following your visit.   A [facility] MyChart account gives you access to today's visit and all your visits, tests, and labs performed at [facility] " click here if you don't have a [facility] MyChart account or go to [email or web address]  Consent: (Patient) Stephanie Hogan provided verbal consent for this virtual visit at the beginning of the encounter.  Current Medications:  Current Outpatient Medications:    amoxicillin-clavulanate (AUGMENTIN) 875-125 MG tablet, Take 1 tablet by mouth 2 (two) times daily., Disp: 6 tablet, Rfl: 0   ibuprofen (ADVIL) 800 MG tablet, Take 1 tablet (800 mg total) by mouth daily as needed., Disp: 21 tablet, Rfl: 0   letrozole (FEMARA) 2.5 MG tablet, Take 2.5 mg by mouth daily., Disp: , Rfl:    norethindrone (AYGESTIN) 5 MG tablet, Take 5 mg by mouth at bedtime., Disp: , Rfl:    Prenatal Vit-Fe Fumarate-FA (PRENATAL VITAMIN PO), Take by mouth., Disp: , Rfl:    Medications ordered in this encounter:  No orders of the defined types were placed in this encounter.    *If you need refills on other medications prior to your next appointment, please contact your pharmacy*  Follow-Up: Call back or seek an in-person evaluation if the symptoms worsen or if the condition fails to improve as anticipated.  [facility] [phone]  Other Instructions urine culture grew   Klebsiella pneumoniae Abnormal    Augmentin should work fine to resolve it. Continue to force fluids and take antibiotics as prescribed   If you have been instructed to have an in-person evaluation today at a local Urgent Care facility, please use the link below. It will take you to a list of all of our  available [facility], including address, phone number and hours of operation. Please do not delay care.  [facility]  If you or a family member do not have a primary care provider, use the link below to schedule a visit and establish care. When you choose a [facility] primary care physician or advanced practice provider, you gain a long-term partner in health. Find a Primary Care Provider  Learn more about [facility]'s in-office and virtual care options: [facility] - Get Care Now

## 2023-03-27 NOTE — Progress Notes (Signed)
Virtual Visit Consent   Stephanie Hogan, you are scheduled for a virtual visit with Stephanie Daphine Deutscher, FNP, a [facility] provider, today.     Just as with appointments in the office, your consent must be obtained to participate.  Your consent will be active for this visit and any virtual visit you may have with one of our providers in the next 365 days.     If you have a MyChart account, a copy of this consent can be sent to you electronically.  All virtual visits are billed to your insurance company just like a traditional visit in the office.    As this is a virtual visit, video technology does not allow for your provider to perform a traditional examination.  This may limit your provider's ability to fully assess your condition.  If your provider identifies any concerns that need to be evaluated in person or the need to arrange testing (such as labs, EKG, etc.), we will make arrangements to do so.     Although advances in technology are sophisticated, we cannot ensure that it will always work on either your end or our end.  If the connection with a video visit is poor, the visit may have to be switched to a telephone visit.  With either a video or telephone visit, we are not always able to ensure that we have a secure connection.     I need to obtain your verbal consent now.   Are you willing to proceed with your visit today? YES   Stephanie Hogan has provided verbal consent on 03/27/2023 for a virtual visit (video or telephone).   Stephanie Daphine Deutscher, FNP   Date: 03/27/2023 3:31 PM   Virtual Visit via Video Note   I, Stephanie Hogan, connected with Stephanie Hogan ([id number], 1980-09-13) on 03/27/23 at  4:30 PM EDT by a video-enabled telemedicine application and verified that I am speaking with the correct person using two identifiers.  Location: Patient: Virtual Visit Location Patient: Home Provider: Virtual Visit Location Provider: Mobile   I discussed the limitations of evaluation  and management by telemedicine and the availability of in person appointments. The patient expressed understanding and agreed to proceed.    History of Present Illness: Stephanie Hogan is a 42 y.o. who identifies as a female who was assigned female at birth, and is being seen today for test results.  HPI: Patient saw Dr. Linford Arnold on 03/25/23 and did not feel well. She was c/o urinary symptoms. SHe was prescribed Augmentin to take BID while waiting on urinary results. She did end up having a TUI and patient doing video visit after reviewing her my chart results to make sure she did not need another antibiotic.     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    Allergies: No Known Allergies Medications:  Current Outpatient Medications:    amoxicillin-clavulanate (AUGMENTIN) 875-125 MG tablet, Take 1 tablet by mouth 2 (two) times daily., Disp: 6 tablet, Rfl: 0   ibuprofen (ADVIL) 800 MG tablet, Take 1 tablet (800 mg total) by mouth daily as needed., Disp: 21 tablet, Rfl: 0   letrozole (FEMARA) 2.5 MG tablet, Take 2.5 mg by mouth daily.,  Disp: , Rfl:    norethindrone (AYGESTIN) 5 MG tablet, Take 5 mg by mouth at bedtime., Disp: , Rfl:    Prenatal Vit-Fe Fumarate-FA (PRENATAL VITAMIN PO), Take by mouth., Disp: , Rfl:   Observations/Objective: Patient is well-developed, well-nourished in no acute distress.  Resting comfortably  at home.  Head is normocephalic, atraumatic.  No labored breathing.  Speech is clear and coherent with logical content.  Patient is alert and oriented at baseline.    Assessment and Plan: Stephanie Hogan in today with chief complaint of Results   1. Acute  cystitis without hematuria urine culture grew   Klebsiella pneumoniae Abnormal    Augmentin should work fine to resolve it. Continue to force fluids and take antibiotics as prescribed Follow Up Instructions: I discussed the assessment and treatment plan with the patient. The patient was provided an opportunity to ask questions and all were answered. The patient agreed with the plan and demonstrated an understanding of the instructions.  A copy of instructions were sent to the patient via MyChart.  The patient was advised to call back or seek an in-person evaluation if the symptoms worsen or if the condition fails to improve as anticipated.  Time:  I spent 11 minutes with the patient via telehealth technology discussing the above problems/concerns.    Stephanie Daphine Deutscher, FNP

## 2023-03-29 NOTE — Progress Notes (Signed)
Good morning Stephanie Hogan, the urine culture came back positive for bacteria called Klebsiella.  Great news, the antibiotic that I put you on is the appropriate 1.  Just make sure to finish the medication and that should clear everything up.  If you feel like you are still having some persistent symptoms let us know.  You did have some blood present so about 2 weeks after you finish your antibiotic I would like for [place] to get another sample so that we can do a microscopic to check for those whole red blood cells.  I did also like to repeat your protein level at that point.  Really hope you are feeling much better.

## 2023-04-04 ENCOUNTER — Telehealth: Payer: BC Managed Care – PPO | Admitting: Nurse Practitioner

## 2023-04-04 DIAGNOSIS — N390 Urinary tract infection, site not specified: Secondary | ICD-10-CM

## 2023-04-04 MED ORDER — SULFAMETHOXAZOLE-TRIMETHOPRIM 800-160 MG PO TABS: 1.0000 | ORAL_TABLET | Freq: Two times a day (BID) | ORAL | 0 refills | Status: AC

## 2023-04-04 NOTE — Progress Notes (Signed)
Virtual Visit Consent   Stephanie Hogan, you are scheduled for a virtual visit with a [facility] provider today. Just as with appointments in the office, your consent must be obtained to participate. Your consent will be active for this visit and any virtual visit you may have with one of our providers in the next 365 days. If you have a MyChart account, a copy of this consent can be sent to you electronically.  As this is a virtual visit, video technology does not allow for your provider to perform a traditional examination. This may limit your provider's ability to fully assess your condition. If your provider identifies any concerns that need to be evaluated in person or the need to arrange testing (such as labs, EKG, etc.), we will make arrangements to do so. Although advances in technology are sophisticated, we cannot ensure that it will always work on either your end or our end. If the connection with a video visit is poor, the visit may have to be switched to a telephone visit. With either a video or telephone visit, we are not always able to ensure that we have a secure connection.  By engaging in this virtual visit, you consent to the provision of healthcare and authorize for your insurance to be billed (if applicable) for the services provided during this visit. Depending on your insurance coverage, you may receive a charge related to this service.  I need to obtain your verbal consent now. Are you willing to proceed with your visit today? Stephanie Hogan has provided verbal consent on 04/04/2023 for a virtual visit (video or telephone). Stephanie Rigg, NP  Date: 04/04/2023 9:17 AM  Virtual Visit via Video Note   I, Stephanie Hogan, connected with  Stephanie Hogan  ([id number], August 19, 1981) on 04/04/23 at  9:15 AM EDT by a video-enabled telemedicine application and verified that I am speaking with the correct person using two identifiers.  Location: Patient: Virtual Visit Location Patient: Home Provider:  Virtual Visit Location Provider: Home Office   I discussed the limitations of evaluation and management by telemedicine and the availability of in person appointments. The patient expressed understanding and agreed to proceed.    History of Present Illness: Stephanie Hogan is a 42 y.o. who identifies as a female who was assigned female at birth, and is being seen today for possible recurrent UTI.  Stephanie Hogan as treated with Augmentin on 03-25-2023 for UTI (she has completed treatment at this time). She is currently experiencing similar symptoms to previous UTI since completion of abx. Symptoms include low back pain, pelvic pressure dysuria and flank pain. She does not endorse fever, N/V or hematuria.    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    Allergies: No Known Allergies Medications:  Current Outpatient Medications:    sulfamethoxazole-trimethoprim (BACTRIM DS) 800-160 MG tablet, Take 1 tablet by mouth 2 (two) times daily for 7 days., Disp: 14 tablet, Rfl: 0   amoxicillin-clavulanate (AUGMENTIN) 875-125 MG tablet, Take 1 tablet by mouth 2 (two) times daily., Disp: 6 tablet, Rfl: 0   ibuprofen (ADVIL) 800 MG tablet, Take 1 tablet (800 mg total) by mouth daily as needed., Disp: 21 tablet, Rfl: 0  letrozole (FEMARA) 2.5 MG tablet, Take 2.5 mg by mouth daily., Disp: , Rfl:    norethindrone (AYGESTIN) 5 MG tablet, Take 5 mg by mouth at bedtime., Disp: , Rfl:    Prenatal Vit-Fe Fumarate-FA (PRENATAL VITAMIN PO), Take by mouth., Disp: , Rfl:   Observations/Objective: Patient is well-developed, well-nourished in no acute distress.  Resting comfortably at home.  Head is normocephalic,  atraumatic.  No labored breathing.  Speech is clear and coherent with logical content.  Patient is alert and oriented at baseline.    Assessment and Plan: 1. Recurrent UTI - sulfamethoxazole-trimethoprim (BACTRIM DS) 800-160 MG tablet; Take 1 tablet by mouth 2 (two) times daily for 7 days.  Dispense: 14 tablet; Refill: 0   Follow Up Instructions: I discussed the assessment and treatment plan with the patient. The patient was provided an opportunity to ask questions and all were answered. The patient agreed with the plan and demonstrated an understanding of the instructions.  A copy of instructions were sent to the patient via MyChart unless otherwise noted below.    The patient was advised to call back or seek an in-person evaluation if the symptoms worsen or if the condition fails to improve as anticipated.  Time:  I spent 13 minutes with the patient via telehealth technology discussing the above problems/concerns.    Stephanie Rigg, NP

## 2023-04-04 NOTE — Patient Instructions (Signed)
  [name], thank you for joining Claiborne Rigg, NP for today's virtual visit.  While this provider is not your primary care provider (PCP), if your PCP is located in our provider database this encounter information will be shared with them immediately following your visit.   A [facility] MyChart account gives you access to today's visit and all your visits, tests, and labs performed at [facility] " click here if you don't have a [facility] MyChart account or go to [email or web address]  Consent: (Patient) Stephanie Hogan provided verbal consent for this virtual visit at the beginning of the encounter.  Current Medications:  Current Outpatient Medications:    sulfamethoxazole-trimethoprim (BACTRIM DS) 800-160 MG tablet, Take 1 tablet by mouth 2 (two) times daily for 7 days., Disp: 14 tablet, Rfl: 0   amoxicillin-clavulanate (AUGMENTIN) 875-125 MG tablet, Take 1 tablet by mouth 2 (two) times daily., Disp: 6 tablet, Rfl: 0   ibuprofen (ADVIL) 800 MG tablet, Take 1 tablet (800 mg total) by mouth daily as needed., Disp: 21 tablet, Rfl: 0   letrozole (FEMARA) 2.5 MG tablet, Take 2.5 mg by mouth daily., Disp: , Rfl:    norethindrone (AYGESTIN) 5 MG tablet, Take 5 mg by mouth at bedtime., Disp: , Rfl:    Prenatal Vit-Fe Fumarate-FA (PRENATAL VITAMIN PO), Take by mouth., Disp: , Rfl:    Medications ordered in this encounter:  Meds ordered this encounter  Medications   sulfamethoxazole-trimethoprim (BACTRIM DS) 800-160 MG tablet    Sig: Take 1 tablet by mouth 2 (two) times daily for 7 days.    Dispense:  14 tablet    Refill:  0    Order Specific Question:   Supervising Provider    Answer:   Merrilee Jansky [id number]     *If you need refills on other medications prior to your next appointment, please contact your pharmacy*  Follow-Up: Call back or seek an in-person evaluation if the symptoms worsen or if the condition fails to improve as anticipated.  [facility] [phone]  Other Instructions Follow up with PCP for repeat UA   If you have been instructed to have an in-person evaluation today at a local Urgent Care facility, please use the link below. It will take you to a list of all of our available [facility], including address, phone number and hours of operation. Please do not delay care.  [facility]  If you or a family member do not have a primary care provider, use the link below to schedule a visit and establish care. When you choose a [facility] primary care physician or advanced practice provider, you gain a long-term partner in health. Find a Primary Care Provider  Learn more about [facility]'s in-office and virtual care options: [facility] - Get Care Now

## 2023-04-27 ENCOUNTER — Ambulatory Visit: Payer: BC Managed Care – PPO | Admitting: Physician Assistant

## 2023-04-27 VITALS — BP 120/88 | HR 104 | Ht 62.0 in | Wt 191.0 lb

## 2023-04-27 DIAGNOSIS — N979 Female infertility, unspecified: Secondary | ICD-10-CM | POA: Diagnosis not present

## 2023-04-27 DIAGNOSIS — R809 Proteinuria, unspecified: Secondary | ICD-10-CM | POA: Diagnosis not present

## 2023-04-27 LAB — POCT URINALYSIS DIP (CLINITEK)
Bilirubin, UA: NEGATIVE
Blood, UA: NEGATIVE
Glucose, UA: NEGATIVE mg/dL
Ketones, POC UA: NEGATIVE mg/dL
Leukocytes, UA: NEGATIVE
Nitrite, UA: NEGATIVE
POC PROTEIN,UA: NEGATIVE
Spec Grav, UA: >=1.03 — AB (ref 1.010–1.025)
Urobilinogen, UA: 0.2 E.U./dL
pH, UA: 5.5 (ref 5.0–8.0)

## 2023-04-27 LAB — POCT URINE PREGNANCY: Preg Test, Ur: NEGATIVE

## 2023-04-27 NOTE — Progress Notes (Signed)
Established Patient Office Visit  Subjective   Patient ID: Stephanie Hogan, female    DOB: Jan 26, 1981  Age: 42 y.o. MRN: [id number]  Chief Complaint  Patient presents with   Medical Management of Chronic Issues    Dysuria,    HPI Pt is a 42 yo female who presents to the clinic to follow up on UTI and protein in urine from end of July. Pt has had no recent symptoms.   She is wondering if she could be pregnant. She had August 9th ovulation booster and then 2 days later IUI placement. She is supposed to test on Saturday 12 days after if she is pregnant. She wonders if could be positive now.   .. 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    Objective:     BP (!) 129/98   Pulse (!) 104   Ht 5\' 2"  (1.575 m)   Wt 191 lb (86.6 kg)   SpO2 99%   BMI 34.93 kg/m  BP Readings from Last 3 Encounters:  04/27/23 120/88  03/25/23 116/63  03/07/23 (!) 132/92   Wt Readings from Last 3 Encounters:  04/27/23 191 lb (86.6 kg)  03/25/23 188 lb (85.3 kg)  03/07/23 188 lb (85.3 kg)      Physical Exam Constitutional:      Appearance: Normal appearance.  HENT:     Head: Normocephalic.  Cardiovascular:     Rate and Rhythm: Normal rate and regular rhythm.     Heart sounds: Normal heart sounds.  Pulmonary:     Effort: Pulmonary effort  is normal.     Breath sounds: Normal breath sounds.  Abdominal:     General: There is no distension.     Palpations: Abdomen is soft. There is no mass.     Tenderness: There is no abdominal tenderness. There is no right CVA tenderness, left CVA tenderness, guarding or rebound.     Hernia: No hernia is present.  Neurological:     General: No focal deficit present.     Mental Status: She is alert and oriented to person, place, and time.  Psychiatric:        Mood and Affect: Mood normal.     .. Results for orders placed or performed in visit on 04/27/23  POCT URINALYSIS DIP (CLINITEK)  Result Value Ref Range   Color, UA yellow yellow   Clarity, UA clear clear   Glucose, UA negative negative mg/dL   Bilirubin, UA negative negative   Ketones, POC UA negative negative mg/dL   Spec Grav, UA >=4.098 (A) 1.010 - 1.025   Blood, UA negative negative   pH, UA 5.5 5.0 - 8.0   POC PROTEIN,UA negative negative, trace   Urobilinogen, UA 0.2 0.2 or 1.0 E.U./dL   Nitrite, UA  Negative Negative   Leukocytes, UA Negative Negative  POCT urine pregnancy  Result Value Ref Range   Preg Test, Ur Negative Negative      Assessment & Plan:  Marland KitchenMarland KitchenVictorie was seen today for medical management of chronic issues.  Diagnoses and all orders for this visit:  Proteinuria, unspecified type -     POCT URINALYSIS DIP (CLINITEK) -     Cancel: Urine Culture -     Cancel: POCT Pregnancy, Urine -     POCT urine pregnancy   UA dipstick is normal.  Reassured patient UPT negative, encouraged patient to check on Saturday when it has been 12 days.  Follow up with fertility.     Tandy Gaw, PA-C

## 2023-05-05 ENCOUNTER — Encounter: Payer: Self-pay | Admitting: Physician Assistant

## 2023-05-05 ENCOUNTER — Ambulatory Visit: Payer: BC Managed Care – PPO | Admitting: Physician Assistant

## 2023-05-05 VITALS — BP 127/73 | HR 97 | Ht 63.0 in | Wt 191.0 lb

## 2023-05-05 DIAGNOSIS — R0981 Nasal congestion: Secondary | ICD-10-CM | POA: Diagnosis not present

## 2023-05-05 DIAGNOSIS — J3489 Other specified disorders of nose and nasal sinuses: Secondary | ICD-10-CM

## 2023-05-05 DIAGNOSIS — H109 Unspecified conjunctivitis: Secondary | ICD-10-CM | POA: Diagnosis not present

## 2023-05-05 MED ORDER — OFLOXACIN 0.3 % OP SOLN: 1.0000 [drp] | Freq: Four times a day (QID) | OPHTHALMIC | 0 refills | Status: DC

## 2023-05-05 MED ORDER — AMOXICILLIN-POT CLAVULANATE 875-125 MG PO TABS: 1.0000 | ORAL_TABLET | Freq: Two times a day (BID) | ORAL | 0 refills | Status: DC

## 2023-05-05 NOTE — Progress Notes (Signed)
Acute Office Visit  Subjective:     Patient ID: Stephanie Hogan, female    DOB: [birth year], 42 y.o.   MRN: [id number]  Chief Complaint  Patient presents with   possible conjunctivitis    C/o  Right eye pain, eye matted closed this morning,  watery, itchy and draining fluid x this morning.     HPI Patient is in today for right eye pain and discharge since this morning. No fever, chills, body aches. She is having some sinus pressure and congestion. Worried about getting a "sinus infection". 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 Eye matted shut this morning.   .. 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     Objective:    BP 127/73   Pulse 97   Ht 5\' 3"  (1.6 m)   Wt 191 lb (86.6 kg)   SpO2 97%   BMI 33.83 kg/m  BP Readings from Last 3 Encounters:  05/05/23 127/73  04/27/23 120/88  03/25/23 116/63   Wt Readings from Last 3 Encounters:  05/05/23 191 lb (86.6 kg)  04/27/23 191 lb (86.6 kg)  03/25/23 188 lb (85.3 kg)      Physical Exam Constitutional:      Appearance: Normal appearance.  HENT:     Head: Normocephalic.     Right Ear: Tympanic membrane, ear canal and external ear normal. There is no impacted cerumen.     Left Ear: Tympanic membrane, ear canal and external ear normal. There is no impacted  cerumen.     Nose: Congestion present.     Mouth/Throat:     Mouth: Mucous membranes are moist.     Pharynx: Posterior oropharyngeal erythema present. No oropharyngeal exudate.  Eyes:     Comments: Right erythematous and swollen upper eyelid with green purulent discharge, injected conjunctiva  Neurological:     Mental Status: She is alert.         Assessment & Plan:  Marland KitchenMarland KitchenAmii was seen today for possible conjunctivitis.  Diagnoses and all orders for this visit:  Bacterial conjunctivitis of right eye -     ofloxacin (OCUFLOX) 0.3 % ophthalmic solution; Place 1 drop into both eyes 4 (four) times daily. For 7 days.  Sinus pressure -     amoxicillin-clavulanate (AUGMENTIN) 875-125 MG tablet; Take 1 tablet by mouth 2 (two) times daily.  Sinus congestion -     amoxicillin-clavulanate (AUGMENTIN) 875-125 MG tablet; Take 1 tablet by mouth 2 (two) times daily.   Use drops for right eye and only use in left if symptomatic Warm compresses Out of work until 11:30 tomorrow, not given  Hold augmentin Hx of sinus infection and starting to have symptoms Symptomatic care for next few days and if symptoms progress start augmentin  Tandy Gaw,  PA-C

## 2023-05-05 NOTE — Patient Instructions (Signed)
 Bacterial Conjunctivitis, Adult  Bacterial conjunctivitis is an infection of your conjunctiva. This is the clear membrane that covers the white part of your eye and the inner part of your eyelid. This infection can make your eye:  Red or pink.  Itchy or irritated.  This condition spreads easily from person to person (is contagious) and from one eye to the other eye.  What are the causes?  This condition is caused by germs (bacteria). You may get the infection if you come into close contact with:  A person who has the infection.  Items that have germs on them (are contaminated), such as face towels, contact lens solution, or eye makeup.  What increases the risk?  You are more likely to get this condition if:  You have contact with people who have the infection.  You wear contact lenses.  You have a sinus infection.  You have had a recent eye injury or surgery.  You have a weak body defense system (immune system).  You have dry eyes.  What are the signs or symptoms?    Thick, yellowish discharge from the eye.  Tearing or watery eyes.  Itchy eyes.  Burning feeling in your eyes.  Eye redness.  Swollen eyelids.  Blurred vision.  How is this treated?    Antibiotic eye drops or ointment.  Antibiotic medicine taken by mouth. This is used for infections that do not get better with drops or ointment or that last more than 10 days.  Cool, wet cloths placed on the eyes.  Artificial tears used 2-6 times a day.  Follow these instructions at home:  Medicines  Take or apply your antibiotic medicine as told by your doctor. Do not stop using it even if you start to feel better.  Take or apply over-the-counter and prescription medicines only as told by your doctor.  Do not touch your eyelid with the eye-drop bottle or the ointment tube.  Managing discomfort  Wipe any fluid from your eye with a warm, wet washcloth or a cotton ball.  Place a clean, cool, wet cloth on your eye. Do this for 10-20 minutes, 3-4 times a day.  General  instructions  Do not wear contacts until the infection is gone. Wear glasses until your doctor says it is okay to wear contacts again.  Do not wear eye makeup until the infection is gone. Throw away old eye makeup.  Change or wash your pillowcase every day.  Do not share towels or washcloths.  Wash your hands often with soap and water for at least 20 seconds and especially before touching your face or eyes. Use paper towels to dry your hands.  Do not touch or rub your eyes.  Do not drive or use heavy machinery if your vision is blurred.  Contact a doctor if:  You have a fever.  You do not get better after 10 days.  Get help right away if:  You have a fever and your symptoms get worse all of a sudden.  You have very bad pain when you move your eye.  Your face:  Hurts.  Is red.  Is swollen.  You have sudden loss of vision.  Summary  Bacterial conjunctivitis is an infection of your conjunctiva.  This infection spreads easily from person to person.  Wash your hands often with soap and water for at least 20 seconds and especially before touching your face or eyes. Use paper towels to dry your hands.  Take or apply your  antibiotic medicine as told by your doctor.  Contact a doctor if you have a fever or you do not get better after 10 days.  This information is not intended to replace advice given to you by your health care provider. Make sure you discuss any questions you have with your health care provider.  Document Revised: 12/04/2020 Document Reviewed: 12/04/2020  Elsevier Patient Education  2024 [employer].

## 2023-06-29 ENCOUNTER — Other Ambulatory Visit: Payer: Self-pay

## 2023-06-29 ENCOUNTER — Ambulatory Visit
Admission: EM | Admit: 2023-06-29 | Discharge: 2023-06-29 | Disposition: A | Discharge status: Home / Self Care | Payer: BC Managed Care – PPO | Attending: Family Medicine | Admitting: Family Medicine

## 2023-06-29 DIAGNOSIS — J209 Acute bronchitis, unspecified: Secondary | ICD-10-CM

## 2023-06-29 DIAGNOSIS — R062 Wheezing: Secondary | ICD-10-CM

## 2023-06-29 LAB — POC SARS CORONAVIRUS 2 AG -  ED: SARS Coronavirus 2 Ag: NEGATIVE

## 2023-06-29 MED ORDER — ALBUTEROL SULFATE HFA 108 (90 BASE) MCG/ACT IN AERS: 1.0000 | INHALATION_SPRAY | Freq: Four times a day (QID) | RESPIRATORY_TRACT | 0 refills | Status: DC | PRN

## 2023-06-29 MED ORDER — AZITHROMYCIN 250 MG PO TABS: ORAL_TABLET | ORAL | 0 refills | Status: DC

## 2023-06-29 MED ORDER — ONDANSETRON HCL 8 MG PO TABS: 8.0000 mg | ORAL_TABLET | Freq: Three times a day (TID) | ORAL | 0 refills | Status: DC | PRN

## 2023-06-29 NOTE — Discharge Instructions (Signed)
Use the albuterol inhaler for wheezing Take Mucinex DM for the cough Take this pack as directed.  2 pills today then 1 a day until gone I have prescribed Zofran to take as needed for nausea Make sure you are drinking lots of fluids, try to get some rest Call if not improving towards the end of the week

## 2023-06-29 NOTE — ED Triage Notes (Signed)
Fever, cough, headache, chest tightness since yesterday. Took some dayquil and tylenol earlier today.

## 2023-06-29 NOTE — ED Provider Notes (Signed)
[id number]    CSN: [id number] Arrival date & time: 06/29/23  1639      History   Chief Complaint No chief complaint on file.   HPI Stephanie Hogan is a 42 y.o. female.   HPI  Patient states that since yesterday she has had headache and body aches fever fever and weakness.  Sore throat and headache.  She states that her chest feels tight when she coughs and takes a deep breath.  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 Authorizing Provider  acetaminophen (TYLENOL) 325 MG tablet Take 650 mg by mouth every 6 (six) hours as needed.   Yes [provider]  albuterol (VENTOLIN HFA) 108 (90 Base) MCG/ACT inhaler Inhale 1-2 puffs into the lungs every 6 (six) hours as needed for wheezing or shortness of breath. 06/29/23  Yes Eustace Moore, MD  azithromycin (ZITHROMAX Z-PAK) 250 MG tablet Take two pills today followed by one a day until gone 06/29/23  Yes Eustace Moore, MD  ondansetron (ZOFRAN) 8 MG tablet Take 1 tablet (8 mg total) by mouth every 8 (eight) hours as needed for nausea or vomiting. 06/29/23  Yes Eustace Moore, MD   Pseudoephedrine-APAP-DM (DAYQUIL PO) Take by mouth.   Yes [provider]    Family History Family History  Problem Relation Age of Onset   Breast cancer Maternal Grandmother    Lung cancer Maternal Grandfather    Alzheimer's disease Paternal Grandfather     Social History Social History   Tobacco Use   Smoking status: Never   Smokeless tobacco: Never  Vaping Use   Vaping status: Never Used  Substance Use Topics   Alcohol use: Yes    Alcohol/week: 2.0 standard drinks of alcohol    Types: 2 Standard drinks or equivalent per week    Comment: Occasional   Drug use: No     Allergies   Bactrim [sulfamethoxazole-trimethoprim]   Review of Systems Review of Systems  See HPI Physical Exam Triage Vital Signs ED Triage Vitals  Encounter Vitals Group     BP 06/29/23 1645 (!) 142/87     Systolic BP Percentile --      Diastolic BP Percentile --      Pulse Rate 06/29/23 1645 95     Resp 06/29/23 1645 16  Temp 06/29/23 1645 99.6 F (37.6 C)     Temp Source 06/29/23 1645 Oral     SpO2 06/29/23 1645 99 %     Weight --      Height --      Head Circumference --      Peak Flow --      Pain Score 06/29/23 1649 6     Pain Loc --      Pain Education --      Exclude from Growth Chart --    No data found.  Updated Vital Signs BP (!) 142/87   Pulse 95   Temp 99.6 F (37.6 C) (Oral)   Resp 16   SpO2 99%       Physical Exam Constitutional:      General: She is not in acute distress.    Appearance: She is well-developed. She is ill-appearing.  HENT:     Head: Normocephalic and atraumatic.     Mouth/Throat:     Mouth: Mucous membranes are moist.     Pharynx: Posterior oropharyngeal erythema present.  Eyes:     Conjunctiva/sclera: Conjunctivae normal.     Pupils: Pupils are equal, round, and reactive to light.  Cardiovascular:     Rate and Rhythm: Tachycardia present.     Heart sounds: Normal heart sounds.  Pulmonary:     Effort: Pulmonary effort is  normal. No respiratory distress.     Breath sounds: Rhonchi present.  Abdominal:     General: There is no distension.     Palpations: Abdomen is soft.  Musculoskeletal:        General: Normal range of motion.     Cervical back: Normal range of motion.  Skin:    General: Skin is warm and dry.  Neurological:     Mental Status: She is alert.      UC Treatments / Results  Labs (all labs ordered are listed, but only abnormal results are displayed) Labs Reviewed  POC SARS CORONAVIRUS 2 AG -  ED    EKG   Radiology No results found.  Procedures Procedures (including critical care time)  Medications Ordered in UC Medications - No data to display  Initial Impression / Assessment and Plan / UC Course  I have reviewed the triage vital signs and the nursing notes.  Pertinent labs & imaging results that were available during my care of the patient were reviewed by me and considered in my medical decision making (see chart for details).     I did tell her that I believe this is a viral infection..  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  I advised her to fill and take the antibiotic if she fails to improve with conservative management Final Clinical Impressions(s) / UC Diagnoses   Final diagnoses:  Acute bronchitis, unspecified organism  Wheezing     Discharge Instructions      Use the albuterol inhaler for wheezing Take Mucinex DM for the cough Take this pack as directed.  2 pills today then 1 a day until gone I have prescribed Zofran to take as needed for nausea Make sure you are drinking lots of fluids, try to get some rest Call if not improving towards the end of the week     ED Prescriptions     Medication Sig Dispense Auth. Provider   azithromycin (ZITHROMAX Z-PAK) 250 MG tablet Take two pills today followed by one a day until gone 6  tablet Eustace Moore, MD   ondansetron (ZOFRAN) 8 MG tablet  Take 1 tablet (8 mg total) by mouth every 8 (eight) hours as needed for nausea or vomiting. 20 tablet Eustace Moore, MD   albuterol (VENTOLIN HFA) 108 (90 Base) MCG/ACT inhaler Inhale 1-2 puffs into the lungs every 6 (six) hours as needed for wheezing or shortness of breath. 18 g Eustace Moore, MD      PDMP not reviewed this encounter.   Eustace Moore, MD 06/29/23 [phone]

## 2023-09-14 ENCOUNTER — Encounter: Payer: Self-pay | Admitting: Physician Assistant

## 2023-09-14 ENCOUNTER — Ambulatory Visit (INDEPENDENT_AMBULATORY_CARE_PROVIDER_SITE_OTHER): Payer: Self-pay | Admitting: Physician Assistant

## 2023-09-14 VITALS — BP 131/94 | HR 98 | Temp 97.4°F | Resp 16 | Ht 63.0 in | Wt 191.0 lb

## 2023-09-14 DIAGNOSIS — N3001 Acute cystitis with hematuria: Secondary | ICD-10-CM

## 2023-09-14 DIAGNOSIS — R300 Dysuria: Secondary | ICD-10-CM

## 2023-09-14 DIAGNOSIS — R4589 Other symptoms and signs involving emotional state: Secondary | ICD-10-CM

## 2023-09-14 LAB — POCT URINALYSIS DIP (CLINITEK)
Bilirubin, UA: NEGATIVE
Glucose, UA: NEGATIVE mg/dL
Ketones, POC UA: NEGATIVE mg/dL
Nitrite, UA: NEGATIVE
POC PROTEIN,UA: 100 — AB
Spec Grav, UA: 1.02 (ref 1.010–1.025)
Urobilinogen, UA: 0.2 U/dL
pH, UA: 6 (ref 5.0–8.0)

## 2023-09-14 MED ORDER — AMOXICILLIN-POT CLAVULANATE 875-125 MG PO TABS: 1.0000 | ORAL_TABLET | Freq: Two times a day (BID) | ORAL | 0 refills | Status: DC

## 2023-09-14 NOTE — Progress Notes (Signed)
 Established Patient Office Visit  Subjective   Patient ID: Stephanie Hogan, female    DOB: Jul 22, 1981  Age: 43 y.o. MRN: [id number]  Chief Complaint  Patient presents with   Dysuria    HPI Pt is a 43 yo female who presents to the clinic to follow up on UTI from 12/24. She was given macrobid  after virtual visit. She just feels full in suprapubic area and like symptoms have not cleared. No abdominal or flank pain. Denies nausea, vomiting, fever, body aches. She had one day of chills but resolved after resting.   She is worried about her health after so many people around her have cancer or sick. She wants to make sure she looks ok.    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  Objective:     BP (!) 131/94   Pulse 98   Temp (!) 97.4 F (36.3 C) (Oral)   Resp 16   Ht 5' 3 (1.6 m)   Wt 191 lb (86.6 kg)   SpO2 99%   BMI 33.83 kg/m  BP Readings from Last 3 Encounters:  09/14/23 (!) 131/94  06/29/23 (!) 142/87  05/05/23 127/73   Wt Readings from Last 3 Encounters:  09/14/23 191 lb (86.6 kg)  05/05/23 191 lb (86.6 kg)  04/27/23 191 lb (86.6 kg)    .SABRA Results for orders placed or  performed in visit on 09/14/23  POCT URINALYSIS DIP (CLINITEK)   Collection Time: 09/14/23 11:53 AM  Result Value Ref Range   Color, UA yellow yellow   Clarity, UA cloudy (A) clear   Glucose, UA negative negative mg/dL   Bilirubin, UA negative negative   Ketones, POC UA negative negative mg/dL   Spec Grav, UA 8.979 8.989 - 1.025   Blood, UA moderate (A) negative   pH, UA 6.0 5.0 - 8.0   POC PROTEIN,UA =100 (A) negative, trace   Urobilinogen, UA 0.2 0.2 or 1.0 E.U./dL   Nitrite, UA Negative Negative   Leukocytes, UA Large (3+) (A) Negative     Physical Exam Constitutional:      Appearance: Normal appearance.  HENT:     Head: Normocephalic.  Cardiovascular:     Rate and Rhythm: Normal rate.  Pulmonary:     Effort: Pulmonary effort is normal.  Abdominal:     General: There is no distension.     Palpations: Abdomen is soft. There is no mass.     Tenderness: There is no abdominal tenderness. There is no right CVA tenderness, left CVA tenderness, guarding or rebound.     Hernia: No hernia is present.  Musculoskeletal:     Right lower leg: No edema.     Left lower leg: No edema.  Neurological:     General: No focal deficit present.     Mental Status: She is alert and oriented to person, place, and time.  Psychiatric:        Mood and Affect: Mood normal.         Assessment & Plan:  SABRASABRAAurie was seen today for dysuria.  Diagnoses and all orders for this visit:  Acute cystitis with hematuria -     CMP14+EGFR -     CBC w/Diff/Platelet -     amoxicillin -clavulanate (AUGMENTIN ) 875-125 MG tablet; Take 1 tablet by mouth 2 (two) times daily.  Dysuria -     POCT URINALYSIS DIP (CLINITEK) -     Urine Culture -     CMP14+EGFR -     CBC w/Diff/Platelet  Anxiety about health   UA positive for blood, leuks, protein Will culture Treated empirically for UTI with augmentin  No culture done on last visit on 12/24  Pt concerned about health with so many people around her  sick Cmp/cbc ordered Reassurance given based on physical exam   Vermell Bologna, PA-C

## 2023-09-15 ENCOUNTER — Encounter: Payer: Self-pay | Admitting: Physician Assistant

## 2023-09-15 LAB — CBC WITH DIFFERENTIAL/PLATELET
Basophils Absolute: 0 10*3/uL (ref 0.0–0.2)
Basos: 0 %
EOS (ABSOLUTE): 0.1 10*3/uL (ref 0.0–0.4)
Eos: 2 %
Hematocrit: 42.8 % (ref 34.0–46.6)
Hemoglobin: 14 g/dL (ref 11.1–15.9)
Immature Grans (Abs): 0 10*3/uL (ref 0.0–0.1)
Immature Granulocytes: 1 %
Lymphocytes Absolute: 1.6 10*3/uL (ref 0.7–3.1)
Lymphs: 22 %
MCH: 29.2 pg (ref 26.6–33.0)
MCHC: 32.7 g/dL (ref 31.5–35.7)
MCV: 89 fL (ref 79–97)
Monocytes Absolute: 0.4 10*3/uL (ref 0.1–0.9)
Monocytes: 5 %
Neutrophils Absolute: 5 10*3/uL (ref 1.4–7.0)
Neutrophils: 70 %
Platelets: 225 10*3/uL (ref 150–450)
RBC: 4.8 x10E6/uL (ref 3.77–5.28)
RDW: 13.6 % (ref 11.7–15.4)
WBC: 7.1 10*3/uL (ref 3.4–10.8)

## 2023-09-15 LAB — CMP14+EGFR
ALT: 21 [IU]/L (ref 0–32)
AST: 20 [IU]/L (ref 0–40)
Albumin: 4.4 g/dL (ref 3.9–4.9)
Alkaline Phosphatase: 102 [IU]/L (ref 44–121)
BUN/Creatinine Ratio: 18 (ref 9–23)
BUN: 16 mg/dL (ref 6–24)
Bilirubin Total: 0.5 mg/dL (ref 0.0–1.2)
CO2: 23 mmol/L (ref 20–29)
Calcium: 8.8 mg/dL (ref 8.7–10.2)
Chloride: 102 mmol/L (ref 96–106)
Creatinine, Ser: 0.87 mg/dL (ref 0.57–1.00)
Globulin, Total: 2.4 g/dL (ref 1.5–4.5)
Glucose: 84 mg/dL (ref 70–99)
Potassium: 4.3 mmol/L (ref 3.5–5.2)
Sodium: 138 mmol/L (ref 134–144)
Total Protein: 6.8 g/dL (ref 6.0–8.5)
eGFR: 85 mL/min/{1.73_m2} (ref >59)

## 2023-09-15 NOTE — Progress Notes (Signed)
 Tiah,   Kidney, liver, glucose look great.  WBC and hemoglobin normal.   Labs look fabulous.

## 2023-09-16 LAB — URINE CULTURE

## 2023-09-16 NOTE — Progress Notes (Signed)
 No significant bacteria in urine! Looks great.

## 2023-10-06 ENCOUNTER — Encounter: Payer: Self-pay | Admitting: Family Medicine

## 2023-10-06 ENCOUNTER — Ambulatory Visit (INDEPENDENT_AMBULATORY_CARE_PROVIDER_SITE_OTHER): Payer: 59 | Admitting: Family Medicine

## 2023-10-06 VITALS — BP 136/121 | HR 104 | Ht 63.0 in | Wt 186.2 lb

## 2023-10-06 DIAGNOSIS — J019 Acute sinusitis, unspecified: Secondary | ICD-10-CM | POA: Diagnosis not present

## 2023-10-06 DIAGNOSIS — R030 Elevated blood-pressure reading, without diagnosis of hypertension: Secondary | ICD-10-CM | POA: Insufficient documentation

## 2023-10-06 DIAGNOSIS — B9689 Other specified bacterial agents as the cause of diseases classified elsewhere: Secondary | ICD-10-CM | POA: Insufficient documentation

## 2023-10-06 MED ORDER — FLUTICASONE PROPIONATE 50 MCG/ACT NA SUSP: 2.0000 | Freq: Every day | NASAL | 6 refills | Status: DC

## 2023-10-06 MED ORDER — AMOXICILLIN-POT CLAVULANATE 875-125 MG PO TABS: 1.0000 | ORAL_TABLET | Freq: Two times a day (BID) | ORAL | 0 refills | Status: AC

## 2023-10-06 NOTE — Assessment & Plan Note (Signed)
Pt says blood pressure is usually high at the beginning of the visit, will repeat. Also likely elevated today due to her not feeling well

## 2023-10-06 NOTE — Assessment & Plan Note (Addendum)
Due to duration of symptoms and sinus pain/pressure do believe pt has sinus infection. Admits to ear ache as well. Ear exam is normal however recommended flonase for treatment - pt currently on letrozole for fertility and had a discussion about safety of antibiotics and have sent in augmentin

## 2023-10-06 NOTE — Progress Notes (Signed)
Acute Office Visit  Subjective:     Patient ID: Stephanie Hogan, female    DOB: 1980/11/17, 43 y.o.   MRN: [id number]  Chief Complaint  Patient presents with   Facial Pain    Pt has had congestion, ear pain, and sinus headache x1day    HPI Patient is in today for concerns of sinus pain and pressure in addition to congestion.  Review of Systems  Constitutional:  Negative for chills and fever.  Respiratory:  Negative for cough and shortness of breath.   Cardiovascular:  Negative for chest pain.  Neurological:  Negative for headaches.        Objective:    BP (!) 136/121 (BP Location: Left Arm, Patient Position: Sitting, Cuff Size: Large)   Pulse (!) 104   Ht 5\' 3"  (1.6 m)   Wt 186 lb 4 oz (84.5 kg)   SpO2 100%   BMI 32.99 kg/m    Physical Exam Vitals and nursing note reviewed.  Constitutional:      General: She is not in acute distress.    Appearance: Normal appearance.  HENT:     Head: Normocephalic and atraumatic.     Comments: Tenderness to palpation of maxillary and frontal sinuses bilaterally    Right Ear: Tympanic membrane, ear canal and external ear normal.     Left Ear: Tympanic membrane, ear canal and external ear normal.     Nose: Congestion present.  Eyes:     Conjunctiva/sclera: Conjunctivae normal.  Cardiovascular:     Rate and Rhythm: Normal rate and regular rhythm.  Pulmonary:     Effort: Pulmonary effort is normal.     Breath sounds: Normal breath sounds.  Neurological:     General: No focal deficit present.     Mental Status: She is alert and oriented to person, place, and time.  Psychiatric:        Mood and Affect: Mood normal.        Behavior: Behavior normal.        Thought Content: Thought content normal.        Judgment: Judgment normal.     No results found for any visits on 10/06/23.      Assessment & Plan:   Problem List Items Addressed This Visit       Respiratory   Acute bacterial sinusitis - Primary   Due to duration of  symptoms and sinus pain/pressure do believe pt has sinus infection. Admits to ear ache as well. Ear exam is normal however recommended flonase for treatment - pt currently on letrozole for fertility and had a discussion about safety of antibiotics and have sent in augmentin      Relevant Medications   amoxicillin-clavulanate (AUGMENTIN) 875-125 MG tablet   fluticasone (FLONASE) 50 MCG/ACT nasal spray     Other   Elevated blood pressure reading   Pt says blood pressure is usually high at the beginning of the visit, will repeat. Also likely elevated today due to her not feeling well       Meds ordered this encounter  Medications   amoxicillin-clavulanate (AUGMENTIN) 875-125 MG tablet    Sig: Take 1 tablet by mouth 2 (two) times daily for 5 days.    Dispense:  10 tablet    Refill:  0   fluticasone (FLONASE) 50 MCG/ACT nasal spray    Sig: Place 2 sprays into both nostrils daily.    Dispense:  16 g    Refill:  6  No follow-ups on file.  Charlton Amor, DO

## 2023-10-07 ENCOUNTER — Encounter: Payer: Self-pay | Admitting: Physician Assistant

## 2023-10-07 ENCOUNTER — Encounter: Payer: Self-pay | Admitting: Family Medicine

## 2023-10-07 ENCOUNTER — Ambulatory Visit: Payer: Self-pay | Admitting: Physician Assistant

## 2023-10-07 NOTE — Telephone Encounter (Signed)
Copied from CRM [id number]. Topic: Clinical - Medication Question >> Oct 07, 2023  3:11 PM Nila Nephew wrote: Reason for CRM: Patient calling to inquire is Dayquil is an appropriate medication to take that will not affect her fertility or chances of getting pregnant.

## 2023-10-11 NOTE — Telephone Encounter (Signed)
Pt is aware spoke to [place] in regards to this matter

## 2023-12-23 ENCOUNTER — Encounter: Payer: Self-pay | Admitting: Medical-Surgical

## 2023-12-23 ENCOUNTER — Ambulatory Visit: Admitting: Medical-Surgical

## 2023-12-23 VITALS — BP 121/82 | HR 82 | Temp 97.8°F | Ht 63.0 in | Wt 185.0 lb

## 2023-12-23 DIAGNOSIS — R6889 Other general symptoms and signs: Secondary | ICD-10-CM | POA: Diagnosis not present

## 2023-12-23 DIAGNOSIS — J302 Other seasonal allergic rhinitis: Secondary | ICD-10-CM

## 2023-12-23 LAB — POCT RAPID STREP A (OFFICE): Rapid Strep A Screen: NEGATIVE

## 2023-12-23 LAB — POC COVID19 BINAXNOW: SARS Coronavirus 2 Ag: NEGATIVE

## 2023-12-23 LAB — POCT INFLUENZA A/B
Influenza A, POC: NEGATIVE
Influenza B, POC: NEGATIVE

## 2023-12-23 MED ORDER — METHYLPREDNISOLONE 4 MG PO TBPK: ORAL_TABLET | ORAL | 0 refills | Status: DC

## 2023-12-23 MED ORDER — FLUTICASONE PROPIONATE 50 MCG/ACT NA SUSP: 2.0000 | Freq: Every day | NASAL | 6 refills | Status: DC

## 2023-12-23 MED ORDER — LEVOCETIRIZINE DIHYDROCHLORIDE 5 MG PO TABS: 5.0000 mg | ORAL_TABLET | Freq: Every evening | ORAL | 0 refills | Status: DC

## 2023-12-23 NOTE — Progress Notes (Signed)
        Established patient visit  History, exam, impression, and plan:  1. Flu-like symptoms (Primary) 2. Seasonal allergies Pleasant 43 year old female presenting today with complaints of 1 week of upper respiratory symptoms including sinus congestion, burning high in her nose, clear nasal discharge, nonproductive cough, right ear pressure/clogged, and chest tightness. Denies fever, chills, chest pain, body aches, and GI s/s. POCT negative for flu/covid/strep. On exam, right ear MEE, posterior oropharyngeal erythema, swollen nasal mucosa with erythema. No frontal or maxillary tenderness or cervical lymphadenopathy. HRR, SI/S2 normal. Lungs CTA, respirations even, unlabored. Symptoms consistent with exacerbation of seasonal allergies, less likely a viral URI given lack of fever/chills/body aches. Plan for treatment with Flonase , Xyzal , and Medrol  dosepack.  - POC COVID-19 - POCT rapid strep A - POCT Influenza A/B  Procedures performed this visit: None.  Return if symptoms worsen or fail to improve.  __________________________________ Maryl Snook, DNP, APRN, FNP-BC Primary Care and Sports Medicine [facility] [place]

## 2023-12-28 ENCOUNTER — Encounter: Payer: Self-pay | Admitting: Medical-Surgical

## 2024-01-05 ENCOUNTER — Encounter: Payer: Self-pay | Admitting: Physician Assistant

## 2024-01-06 MED ORDER — OFLOXACIN 0.3 % OP SOLN: 1.0000 [drp] | OPHTHALMIC | 0 refills | Status: DC

## 2024-02-01 ENCOUNTER — Telehealth: Admitting: Family Medicine

## 2024-02-01 DIAGNOSIS — R3989 Other symptoms and signs involving the genitourinary system: Secondary | ICD-10-CM | POA: Diagnosis not present

## 2024-02-01 MED ORDER — CEPHALEXIN 500 MG PO CAPS: 500.0000 mg | ORAL_CAPSULE | Freq: Two times a day (BID) | ORAL | 0 refills | Status: DC

## 2024-02-01 MED ORDER — CEPHALEXIN 500 MG PO CAPS: 500.0000 mg | ORAL_CAPSULE | Freq: Two times a day (BID) | ORAL | 0 refills | Status: AC

## 2024-02-01 NOTE — Patient Instructions (Addendum)
 Stephanie Hogan, thank you for joining Stephanie Pion, NP for today's virtual visit.  While this provider is not your primary care provider (PCP), if your PCP is located in our provider database this encounter information will be shared with them immediately following your visit.   A [place] [place] MyChart account gives you access to today's visit and all your visits, tests, and labs performed at [facility] " click here if you don't have a [place] [facility] MyChart account or go to [email or web address]  Consent: (Patient) Stephanie Hogan provided verbal consent for this virtual visit at the beginning of the encounter.  Current Medications:  Current Outpatient Medications:    acetaminophen  (TYLENOL ) 325 MG tablet, Take 650 mg by mouth every 6 (six) hours as needed., Disp: , Rfl:    albuterol  (VENTOLIN  HFA) 108 (90 Base) MCG/ACT inhaler, Inhale 1-2 puffs into the lungs every 6 (six) hours as needed for wheezing or shortness of breath., Disp: 18 g, Rfl: 0   cephALEXin  (KEFLEX ) 500 MG capsule, Take 1 capsule (500 mg total) by mouth 2 (two) times daily for 7 days., Disp: 14 capsule, Rfl: 0   fluticasone  (FLONASE ) 50 MCG/ACT nasal spray, Place 2 sprays into both nostrils daily., Disp: 16 g, Rfl: 6   letrozole (FEMARA) 2.5 MG tablet, Take 2.5 mg by mouth daily., Disp: , Rfl:    levocetirizine (XYZAL ) 5 MG tablet, Take 1 tablet (5 mg total) by mouth every evening., Disp: 90 tablet, Rfl: 0   methylPREDNISolone  (MEDROL  DOSEPAK) 4 MG TBPK tablet, Take as directed., Disp: 21 tablet, Rfl: 0   ofloxacin  (OCUFLOX ) 0.3 % ophthalmic solution, Place 1 drop into both eyes every 4 (four) hours. For 7 days., Disp: 5 mL, Rfl: 0   ondansetron  (ZOFRAN ) 8 MG tablet, Take 1 tablet (8 mg total) by mouth every 8 (eight) hours as needed for nausea or vomiting., Disp: 20 tablet, Rfl: 0   Medications ordered in this encounter:  Meds ordered this encounter  Medications   DISCONTD: cephALEXin  (KEFLEX ) 500 MG capsule    Sig:  Take 1 capsule (500 mg total) by mouth 2 (two) times daily for 7 days.    Dispense:  14 capsule    Refill:  0    Supervising Provider:   Corine Dice [[id number]]   DISCONTD: cephALEXin  (KEFLEX ) 500 MG capsule    Sig: Take 1 capsule (500 mg total) by mouth 2 (two) times daily for 7 days.    Dispense:  14 capsule    Refill:  0    Supervising Provider:   LAMPTEY, PHILIP O [[id number]]   cephALEXin  (KEFLEX ) 500 MG capsule    Sig: Take 1 capsule (500 mg total) by mouth 2 (two) times daily for 7 days.    Dispense:  14 capsule    Refill:  0    Supervising Provider:   Corine Dice [[id number]]     *If you need refills on other medications prior to your next appointment, please contact your pharmacy*  Follow-Up: Call back or seek an in-person evaluation if the symptoms worsen or if the condition fails to improve as anticipated.  [facility] [phone]  Other Instructions  Urinary Tract Infection, Female A urinary tract infection (UTI) is an infection in your urinary tract. The urinary tract is made up of organs that make, store, and get rid of pee (urine) in your body. These organs include: The kidneys. The ureters. The bladder. The urethra. What are the causes? Most UTIs are  caused by germs called bacteria. They may be in or near your genitals. These germs grow and cause swelling in your urinary tract. What increases the risk? You're more likely to get a UTI if: You're a female. The urethra is shorter in females than in males. You have a soft tube called a catheter that drains your pee. You can't control when you pee or poop. You have trouble peeing because of: A kidney stone. A urinary blockage. A nerve condition that affects your bladder. Not getting enough to drink. You're sexually active. You use a birth control inside your vagina, like spermicide. You're pregnant. You have low levels of the hormone estrogen in your body. You're an older adult. You're  also more likely to get a UTI if you have other health problems. These may include: Diabetes. A weak immune system. Your immune system is your body's defense system. Sickle cell disease. Injury of the spine. What are the signs or symptoms? Symptoms may include: Needing to pee right away. Peeing small amounts often. Pain or burning when you pee. Blood in your pee. Pee that smells bad or odd. Pain in your belly or lower back. You may also: Feel confused. This may be the first symptom in older adults. Vomit. Not feel hungry. Feel tired or easily annoyed. Have a fever or chills. How is this diagnosed? A UTI is diagnosed based on your medical history and an exam. You may also have other tests. These may include: Pee tests. Blood tests. Tests for sexually transmitted infections (STIs). If you've had more than one UTI, you may need to have imaging studies done to find out why you keep getting them. How is this treated? A UTI can be treated by: Taking antibiotics or other medicines. Drinking enough fluid to keep your pee pale yellow. In rare cases, a UTI can cause a very bad condition called sepsis. Sepsis may be treated in the hospital. Follow these instructions at home: Medicines Take your medicines only as told by your health care provider. If you were given antibiotics, take them as told by your provider. Do not stop taking them even if you start to feel better. General instructions Make sure you: Pee often and fully. Do not hold your pee for a long time. Wipe from front to back after you pee or poop. Use each tissue only once when you wipe. Pee after you have sex. Do not douche or use sprays or powders in your genital area. Contact a health care provider if: Your symptoms don't get better after 1-2 days of taking antibiotics. Your symptoms go away and then come back. You have a fever or chills. You vomit or feel like you may vomit. Get help right away if: You have very bad  pain in your back or lower belly. You faint. This information is not intended to replace advice given to you by your health care provider. Make sure you discuss any questions you have with your health care provider. Document Revised: 04/01/2023 Document Reviewed: 11/27/2022 Elsevier Patient Education  2024 [employer].   If you have been instructed to have an in-person evaluation today at a local Urgent Care facility, please use the link below. It will take you to a list of all of our available [facility], including address, phone number and hours of operation. Please do not delay care.  [facility]  If you or a family member do not have a primary care provider, use the link below  to schedule a visit and establish care. When you choose a [facility] primary care physician or advanced practice provider, you gain a long-term partner in health. Find a Primary Care Provider  Learn more about [facility] in-office and virtual care options:  - Get Care Now

## 2024-02-01 NOTE — Progress Notes (Signed)
 Virtual Visit Consent   Stephanie Hogan, you are scheduled for a virtual visit with a [facility] provider today. Just as with appointments in the office, your consent must be obtained to participate. Your consent will be active for this visit and any virtual visit you may have with one of our providers in the next 365 days. If you have a MyChart account, a copy of this consent can be sent to you electronically.  As this is a virtual visit, video technology does not allow for your provider to perform a traditional examination. This may limit your provider's ability to fully assess your condition. If your provider identifies any concerns that need to be evaluated in person or the need to arrange testing (such as labs, EKG, etc.), we will make arrangements to do so. Although advances in technology are sophisticated, we cannot ensure that it will always work on either your end or our end. If the connection with a video visit is poor, the visit may have to be switched to a telephone visit. With either a video or telephone visit, we are not always able to ensure that we have a secure connection.  By engaging in this virtual visit, you consent to the provision of healthcare and authorize for your insurance to be billed (if applicable) for the services provided during this visit. Depending on your insurance coverage, you may receive a charge related to this service.  I need to obtain your verbal consent now. Are you willing to proceed with your visit today? Stephanie Hogan has provided verbal consent on 02/01/2024 for a virtual visit (video or telephone). Stephanie Pion, NP  Date: 02/01/2024 9:32 AM   Virtual Visit via Video Note   I, Stephanie Hogan, connected with  Stephanie Hogan  ([id number], Mar 25, 1981) on 02/01/24 at  9:45 AM EDT by a video-enabled telemedicine application and verified that I am speaking with the correct person using two identifiers.  Location: Patient: Virtual Visit Location Patient: Home Provider:  Virtual Visit Location Provider: Home Office   I discussed the limitations of evaluation and management by telemedicine and the availability of in person appointments. The patient expressed understanding and agreed to proceed.    History of Present Illness: Stephanie Hogan is a 43 y.o. who identifies as a female who was assigned female at birth, and is being seen today for UTI  Onset was today with feeling some discomfort and frequent urination and burning. Some bloating in pelvic area. Took an AZO and it helped some. Known history of UTIs several times a year.  Denies blood urine, back pain, fevers or chills. No Hx of stones or kidney infection/disease.     Problems:  Patient Active Problem List   Diagnosis Date Noted   Acute bacterial sinusitis 10/06/2023   Elevated blood pressure reading 10/06/2023   Proteinuria 04/27/2023   Suspicious nevus 02/05/2022   Squamous blepharitis of both upper and lower eyelid of right eye 07/09/2020   Hematoma 10/11/2018   ETD (Eustachian tube dysfunction), right 06/17/2017   Elevated lipase 01/17/2014   ACUTE BRONCHOSPASM 03/15/2008   SPRAIN/STRAIN, LUMBAR REGION 02/23/2007    Allergies:  Allergies  Allergen Reactions   Bactrim  [Sulfamethoxazole -Trimethoprim ]     Nausea/vomiting   Medications:  Current Outpatient Medications:    acetaminophen  (TYLENOL ) 325 MG tablet, Take 650 mg by mouth every 6 (six) hours as needed., Disp: , Rfl:    albuterol  (VENTOLIN  HFA) 108 (90 Base) MCG/ACT inhaler, Inhale 1-2 puffs into the lungs every 6 (  six) hours as needed for wheezing or shortness of breath., Disp: 18 g, Rfl: 0   fluticasone  (FLONASE ) 50 MCG/ACT nasal spray, Place 2 sprays into both nostrils daily., Disp: 16 g, Rfl: 6   letrozole (FEMARA) 2.5 MG tablet, Take 2.5 mg by mouth daily., Disp: , Rfl:    levocetirizine (XYZAL ) 5 MG tablet, Take 1 tablet (5 mg total) by mouth every evening., Disp: 90 tablet, Rfl: 0   methylPREDNISolone  (MEDROL  DOSEPAK) 4 MG  TBPK tablet, Take as directed., Disp: 21 tablet, Rfl: 0   ofloxacin  (OCUFLOX ) 0.3 % ophthalmic solution, Place 1 drop into both eyes every 4 (four) hours. For 7 days., Disp: 5 mL, Rfl: 0   ondansetron  (ZOFRAN ) 8 MG tablet, Take 1 tablet (8 mg total) by mouth every 8 (eight) hours as needed for nausea or vomiting., Disp: 20 tablet, Rfl: 0  Observations/Objective: Patient is well-developed, well-nourished in no acute distress.  Resting comfortably  at home.  Head is normocephalic, atraumatic.  No labored breathing.  Speech is clear and coherent with logical content.  Patient is alert and oriented at baseline.    Assessment and Plan:   1. Suspected UTI (Primary)  - cephALEXin  (KEFLEX ) 500 MG capsule; Take 1 capsule (500 mg total) by mouth 2 (two) times daily for 7 days.  Dispense: 14 capsule; Refill: 0  -no other red flags for stone or kidney infection -increase fluids -complete medication as discussed -prevention discussed and on AVS  Reviewed side effects, risks and benefits of medication.    Patient acknowledged agreement and understanding of the plan.   Past Medical, Surgical, Social History, Allergies, and Medications have been Reviewed.    Follow Up Instructions: I discussed the assessment and treatment plan with the patient. The patient was provided an opportunity to ask questions and all were answered. The patient agreed with the plan and demonstrated an understanding of the instructions.  A copy of instructions were sent to the patient via MyChart unless otherwise noted below.    The patient was advised to call back or seek an in-person evaluation if the symptoms worsen or if the condition fails to improve as anticipated.    Stephanie Pion, NP

## 2024-03-20 ENCOUNTER — Other Ambulatory Visit: Payer: Self-pay | Admitting: Medical-Surgical

## 2024-04-27 ENCOUNTER — Encounter: Payer: Self-pay | Admitting: Family Medicine

## 2024-04-27 ENCOUNTER — Ambulatory Visit: Admitting: Family Medicine

## 2024-04-27 VITALS — BP 131/85 | HR 83 | Temp 98.4°F | Ht 63.0 in | Wt 188.4 lb

## 2024-04-27 DIAGNOSIS — J019 Acute sinusitis, unspecified: Secondary | ICD-10-CM | POA: Diagnosis not present

## 2024-04-27 DIAGNOSIS — B9689 Other specified bacterial agents as the cause of diseases classified elsewhere: Secondary | ICD-10-CM | POA: Diagnosis not present

## 2024-04-27 MED ORDER — AMOXICILLIN 500 MG PO CAPS: 500.0000 mg | ORAL_CAPSULE | Freq: Three times a day (TID) | ORAL | 0 refills | Status: AC

## 2024-04-27 NOTE — Progress Notes (Signed)
 Stephanie Hogan - 43 y.o. female MRN [id number]  Date of birth: 01/27/1981  Subjective Chief Complaint  Patient presents with   URI    HPI Stephanie Hogan is a 43 y.o. female here today with complaint of cought, congestion, sinus pressure, headache.  Symptoms started about 3 days ago. She has tried OTC cold medications without much improvement.  She denies fever or chills.  No GI symptoms. Has had this in the past and amoxicillin  works for her.   ROS:  A comprehensive ROS was completed and negative except as noted per HPI  Allergies  Allergen Reactions   Bactrim  [Sulfamethoxazole -Trimethoprim ]     Nausea/vomiting    Past Medical History:  Diagnosis Date   Asthma    Ovarian cyst     Past Surgical History:  Procedure Laterality Date   CHOLECYSTECTOMY     KNEE SURGERY     MOLE REMOVAL      Social History   Socioeconomic History   Marital status: Married    Spouse name: Not on file   Number of children: Not on file   Years of education: Not on file   Highest education level: Associate degree: academic program  Occupational History   Not on file  Tobacco Use   Smoking status: Never   Smokeless tobacco: Never  Vaping Use   Vaping status: Never Used  Substance and Sexual Activity   Alcohol use: Yes    Alcohol/week: 2.0 standard drinks of alcohol    Types: 2 Standard drinks or equivalent per week    Comment: Occasional   Drug use: No   Sexual activity: Yes    Birth control/protection: None  Other Topics Concern   Not on file  Social History Narrative   Not on file   Social Drivers of Health   Financial Resource Strain: Low Risk  (04/27/2023)   Overall Financial Resource Strain (CARDIA)    Difficulty of Paying Living Expenses: Not hard at all  Food Insecurity: No Food Insecurity (04/27/2023)   Hunger Vital Sign    Worried About Running Out of Food in the Last Year: Never true    Ran Out of Food in the Last Year: Never true  Transportation Needs: No Transportation Needs  (04/27/2023)   PRAPARE - Administrator, Civil Service (Medical): No    Lack of Transportation (Non-Medical): No  Physical Activity: Insufficiently Active (04/27/2023)   Exercise Vital Sign    Days of Exercise per Week: 2 days    Minutes of Exercise per Session: 30 min  Stress: No Stress Concern Present (04/27/2023)   [employer] of Occupational Health - Occupational Stress Questionnaire    Feeling of Stress : Not at all  Social Connections: Socially Integrated (04/27/2023)   Social Connection and Isolation Panel    Frequency of Communication with Friends and Family: More than three times a week    Frequency of Social Gatherings with Friends and Family: More than three times a week    Attends Religious Services: More than 4 times per year    Active Member of [employer] or Organizations: Yes    Attends Engineer, structural: More than 4 times per year    Marital Status: Married    Family History  Problem Relation Age of Onset   Breast cancer Maternal Grandmother    Lung cancer Maternal Grandfather    Alzheimer's disease Paternal Grandfather     Health Maintenance  Topic Date Due   HIV Screening  Never  done   Hepatitis C Screening  Never done   Hepatitis B Vaccines 19-59 Average Risk (1 of 3 - 19+ 3-dose series) Never done   HPV VACCINES (1 - 3-dose SCDM series) Never done   COVID-19 Vaccine (3 - Pfizer risk series) 05/15/2020   Cervical Cancer Screening (HPV/Pap Cotest)  10/27/2023   INFLUENZA VACCINE  04/07/2024   DTaP/Tdap/Td (2 - Td or Tdap) 05/15/2024   Pneumococcal Vaccine  Aged Out   Meningococcal B Vaccine  Aged Out     ----------------------------------------------------------------------------------------------------------------------------------------------------------------------------------------------------------------- Physical Exam BP 131/85 (BP Location: Left Arm, Patient Position: Sitting, Cuff Size: Normal)   Pulse 83   Temp 98.4 F  (36.9 C) (Oral)   Ht 5' 3 (1.6 m)   Wt 188 lb 6.4 oz (85.5 kg)   SpO2 98%   BMI 33.37 kg/m   Physical Exam Constitutional:      Appearance: Normal appearance.  HENT:     Head: Normocephalic and atraumatic.     Nose:     Comments: Maxillary sinus pain, b/l Eyes:     General: No scleral icterus. Cardiovascular:     Rate and Rhythm: Normal rate and regular rhythm.  Pulmonary:     Effort: Pulmonary effort is normal.     Breath sounds: Normal breath sounds.  Neurological:     General: No focal deficit present.     Mental Status: She is alert.  Psychiatric:        Mood and Affect: Mood normal.        Behavior: Behavior normal.     ------------------------------------------------------------------------------------------------------------------------------------------------------------------------------------------------------------------- Assessment and Plan  Acute bacterial sinusitis COVID/Flu testing are negative.  Amoxicillin  has worked for her in the past and will treat with this.  She will let me know if symptoms are worsening.    Meds ordered this encounter  Medications   amoxicillin  (AMOXIL ) 500 MG capsule    Sig: Take 1 capsule (500 mg total) by mouth 3 (three) times daily for 10 days.    Dispense:  30 capsule    Refill:  0    No follow-ups on file.

## 2024-04-27 NOTE — Patient Instructions (Signed)

## 2024-04-27 NOTE — Assessment & Plan Note (Signed)
 COVID/Flu testing are negative.  Amoxicillin  has worked for her in the past and will treat with this.  She will let me know if symptoms are worsening.

## 2024-06-11 ENCOUNTER — Encounter: Payer: Self-pay | Admitting: Emergency Medicine

## 2024-06-11 ENCOUNTER — Other Ambulatory Visit: Payer: Self-pay

## 2024-06-11 ENCOUNTER — Ambulatory Visit
Admission: EM | Admit: 2024-06-11 | Discharge: 2024-06-11 | Disposition: A | Discharge status: Home / Self Care | Attending: Family Medicine | Admitting: Family Medicine

## 2024-06-11 DIAGNOSIS — N3001 Acute cystitis with hematuria: Secondary | ICD-10-CM | POA: Diagnosis present

## 2024-06-11 LAB — POCT URINALYSIS DIP (MANUAL ENTRY)
Bilirubin, UA: NEGATIVE
Glucose, UA: NEGATIVE mg/dL
Ketones, POC UA: NEGATIVE mg/dL
Nitrite, UA: NEGATIVE
Protein Ur, POC: 100 mg/dL — AB
Spec Grav, UA: 1.025 (ref 1.010–1.025)
Urobilinogen, UA: 0.2 U/dL
pH, UA: 6 (ref 5.0–8.0)

## 2024-06-11 MED ORDER — CEFDINIR 300 MG PO CAPS: 300.0000 mg | ORAL_CAPSULE | Freq: Two times a day (BID) | ORAL | 0 refills | Status: AC

## 2024-06-11 NOTE — Discharge Instructions (Addendum)
 Advised patient to take medication as directed with food to completion.  Encouraged to increase daily water intake to 64 ounces per day while taking this medication.  Advised we will follow-up with urine culture results once received.  Advised if symptoms worsen and are unresolved please follow-up with your PCP, [facility] urology, or here for further evaluation.

## 2024-06-11 NOTE — ED Triage Notes (Addendum)
 Patient presents to Urgent Care with complaints of frequent urination, pelvic pressure since 4 days ago. Patient reports symptoms started and took Azo which did help with symptoms. She also notes some low back pain.

## 2024-06-11 NOTE — ED Provider Notes (Signed)
 [name]    CSN: [id number] Arrival date & time: 06/11/24  0908      History   Chief Complaint Chief Complaint  Patient presents with   Urinary Frequency    HPI Stephanie Hogan is a 43 y.o. female.   HPI 43 year old female presents with frequent urination and pelvic pressure for 4 days.  Patient is concerned with possible UTI.  PMH significant for asthma and ovarian cyst.  Past Medical History:  Diagnosis Date   Asthma    Ovarian cyst     Patient Active Problem List   Diagnosis Date Noted   Acute bacterial sinusitis 10/06/2023   Elevated blood pressure reading 10/06/2023   Proteinuria 04/27/2023   ACUTE BRONCHOSPASM 03/15/2008    Past Surgical History:  Procedure Laterality Date   CHOLECYSTECTOMY     KNEE SURGERY     MOLE REMOVAL      OB History     Gravida  1   Para  0   Term  0   Preterm  0   AB  0   Living  0      SAB  0   IAB  0   Ectopic  0   Multiple  0   Live Births               Home Medications    Prior to Admission medications   Medication Sig Start Date End Date Taking? Authorizing Provider  acetaminophen  (TYLENOL ) 325 MG tablet Take 650 mg by mouth every 6 (six) hours as needed.   Yes [provider]  albuterol  (VENTOLIN  HFA) 108 (90 Base) MCG/ACT inhaler Inhale 1-2 puffs into the lungs every 6 (six) hours as needed for wheezing or shortness of breath. 06/29/23  Yes Maranda Jamee Jacob, MD  cefdinir  (OMNICEF ) 300 MG capsule Take 1 capsule (300 mg total) by mouth 2 (two) times daily for 7 days. 06/11/24 06/18/24 Yes Teddy Sharper, FNP  fluticasone  (FLONASE ) 50 MCG/ACT nasal spray Place 2 sprays into both nostrils daily. 12/23/23  Yes Willo Mini, NP  letrozole (FEMARA) 2.5 MG tablet Take 2.5 mg by mouth daily. 12/21/23  Yes [provider]  levocetirizine (XYZAL ) 5 MG tablet TAKE 1 TABLET BY MOUTH EVERY DAY IN THE EVENING 03/22/24  Yes Breeback, Jade L, PA-C  ofloxacin  (OCUFLOX ) 0.3 % ophthalmic  solution Place 1 drop into both eyes every 4 (four) hours. For 7 days. 01/06/24  Yes Breeback, Jade L, PA-C  ondansetron  (ZOFRAN ) 8 MG tablet Take 1 tablet (8 mg total) by mouth every 8 (eight) hours as needed for nausea or vomiting. 06/29/23  Yes Maranda Jamee Jacob, MD    Family History Family History  Problem Relation Age of Onset   Breast cancer Maternal Grandmother    Lung cancer Maternal Grandfather    Alzheimer's disease Paternal Grandfather     Social History Social History   Tobacco Use   Smoking status: Never   Smokeless tobacco: Never  Vaping Use   Vaping status: Never Used  Substance Use Topics   Alcohol use: Yes    Alcohol/week: 2.0 standard drinks of alcohol    Types: 2 Standard drinks or equivalent per week    Comment: Occasional   Drug use: No     Allergies   Bactrim  [sulfamethoxazole -trimethoprim ]   Review of Systems Review of Systems  Genitourinary:  Positive for frequency and pelvic pain.     Physical Exam Triage Vital Signs ED Triage Vitals  Encounter Vitals Group  BP 06/11/24 0920 127/87     Girls Systolic BP Percentile --      Girls Diastolic BP Percentile --      Boys Systolic BP Percentile --      Boys Diastolic BP Percentile --      Pulse Rate 06/11/24 0920 88     Resp 06/11/24 0920 18     Temp 06/11/24 0920 98.3 F (36.8 C)     Temp Source 06/11/24 0920 Oral     SpO2 06/11/24 0920 95 %     Weight --      Height --      Head Circumference --      Peak Flow --      Pain Score 06/11/24 0918 6     Pain Loc --      Pain Education --      Exclude from Growth Chart --    No data found.  Updated Vital Signs BP 127/87 (BP Location: Right Arm)   Pulse 88   Temp 98.3 F (36.8 C) (Oral)   Resp 18   SpO2 95%   Visual Acuity Right Eye Distance:   Left Eye Distance:   Bilateral Distance:    Right Eye Near:   Left Eye Near:    Bilateral Near:     Physical Exam Vitals and nursing note reviewed.  Constitutional:       General: She is not in acute distress.    Appearance: Normal appearance. She is obese. She is not ill-appearing.  HENT:     Head: Normocephalic and atraumatic.     Mouth/Throat:     Mouth: Mucous membranes are moist.     Pharynx: Oropharynx is clear.  Eyes:     Extraocular Movements: Extraocular movements intact.     Conjunctiva/sclera: Conjunctivae normal.     Pupils: Pupils are equal, round, and reactive to light.  Cardiovascular:     Rate and Rhythm: Normal rate and regular rhythm.     Heart sounds: Normal heart sounds.  Pulmonary:     Effort: Pulmonary effort is normal.     Breath sounds: Normal breath sounds. No wheezing, rhonchi or rales.  Abdominal:     Tenderness: There is no right CVA tenderness or left CVA tenderness.  Musculoskeletal:        General: Normal range of motion.  Skin:    General: Skin is warm and dry.  Neurological:     General: No focal deficit present.     Mental Status: She is alert and oriented to person, place, and time. Mental status is at baseline.  Psychiatric:        Mood and Affect: Mood normal.        Behavior: Behavior normal.      UC Treatments / Results  Labs (all labs ordered are listed, but only abnormal results are displayed) Labs Reviewed  POCT URINALYSIS DIP (MANUAL ENTRY) - Abnormal; Notable for the following components:      Result Value   Clarity, UA cloudy (*)    Blood, UA moderate (*)    Protein Ur, POC =100 (*)    Leukocytes, UA Trace (*)    All other components within normal limits  URINE CULTURE    EKG   Radiology No results found.  Procedures Procedures (including critical care time)  Medications Ordered in UC Medications - No data to display  Initial Impression / Assessment and Plan / UC Course  I have reviewed the triage vital signs  and the nursing notes.  Pertinent labs & imaging results that were available during my care of the patient were reviewed by me and considered in my medical decision making  (see chart for details).     MDM: 1.  Acute cystitis with hematuria-UA revealed above, urine culture ordered, Rx'd cefdinir  300 mg capsule: Take 1 capsule twice daily x 7 days. Advised patient to take medication as directed with food to completion.  Encouraged to increase daily water intake to 64 ounces per day while taking this medication.  Advised we will follow-up with urine culture results once received.  Advised if symptoms worsen and are unresolved please follow-up with your PCP, [facility] urology, or here for further evaluation.  Patient discharged home, hemodynamically stable. Final Clinical Impressions(s) / UC Diagnoses   Final diagnoses:  Acute cystitis with hematuria     Discharge Instructions      Advised patient to take medication as directed with food to completion.  Encouraged to increase daily water intake to 64 ounces per day while taking this medication.  Advised we will follow-up with urine culture results once received.  Advised if symptoms worsen and are unresolved please follow-up with your PCP, [facility], or here for further evaluation.     ED Prescriptions     Medication Sig Dispense Auth. Provider   cefdinir  (OMNICEF ) 300 MG capsule Take 1 capsule (300 mg total) by mouth 2 (two) times daily for 7 days. 14 capsule Spence Soberano, FNP      PDMP not reviewed this encounter.   Teddy Sharper, FNP 06/11/24 1011

## 2024-06-14 ENCOUNTER — Ambulatory Visit (HOSPITAL_COMMUNITY): Payer: Self-pay

## 2024-06-14 LAB — URINE CULTURE: Culture: 30000 — AB

## 2024-06-23 ENCOUNTER — Encounter: Payer: Self-pay | Admitting: Physician Assistant

## 2024-06-23 MED ORDER — CIPROFLOXACIN HCL 500 MG PO TABS: 500.0000 mg | ORAL_TABLET | Freq: Two times a day (BID) | ORAL | 0 refills | Status: AC

## 2024-07-05 ENCOUNTER — Ambulatory Visit
Admission: EM | Admit: 2024-07-05 | Discharge: 2024-07-05 | Disposition: A | Discharge status: Home / Self Care | Attending: Family Medicine | Admitting: Family Medicine

## 2024-07-05 ENCOUNTER — Encounter: Payer: Self-pay | Admitting: Emergency Medicine

## 2024-07-05 DIAGNOSIS — R200 Anesthesia of skin: Secondary | ICD-10-CM | POA: Diagnosis not present

## 2024-07-05 DIAGNOSIS — R0789 Other chest pain: Secondary | ICD-10-CM | POA: Diagnosis not present

## 2024-07-05 DIAGNOSIS — R42 Dizziness and giddiness: Secondary | ICD-10-CM | POA: Diagnosis not present

## 2024-07-05 DIAGNOSIS — M542 Cervicalgia: Secondary | ICD-10-CM

## 2024-07-05 MED ORDER — MECLIZINE HCL 12.5 MG PO TABS: 12.5000 mg | ORAL_TABLET | Freq: Three times a day (TID) | ORAL | 0 refills | Status: AC | PRN

## 2024-07-05 NOTE — ED Provider Notes (Signed)
 Stephanie Hogan C[facility]    CSN: [id number] Arrival date & time: 07/05/24  1232      History   Chief Complaint Chief Complaint  Patient presents with   Dizziness    HPI Stephanie Hogan is a 43 y.o. female.   Patient states she felt well yesterday.  She had takedown with her 9-year-old yesterday evening to try to get him to sleep.  She briefly fell asleep.  As she got up to go to the kitchen she felt slightly off balance.  She had a spinning sensation.  Brief nausea.  This has persisted until today.  She also feels like she has some tightness in her neck muscles.  She has numbness that comes and goes in her left arm.  Today when worried about her symptoms she developed some pressure sensation in her chest.  Is worried about her heart.  Has not had any head injury.  Has not had any cold or sinus symptoms.  Does not have any ear pressure or pain.  Does not have any change in hearing No history of heart disease.  No hypertension hyperlipidemia diabetes or family history of heart disease.    Past Medical History:  Diagnosis Date   Asthma    Ovarian cyst     Patient Active Problem List   Diagnosis Date Noted   Acute bacterial sinusitis 10/06/2023   Elevated blood pressure reading 10/06/2023   Proteinuria 04/27/2023   ACUTE BRONCHOSPASM 03/15/2008    Past Surgical History:  Procedure Laterality Date   CHOLECYSTECTOMY     KNEE SURGERY     MOLE REMOVAL      OB History     Gravida  1   Para  0   Term  0   Preterm  0   AB  0   Living  0      SAB  0   IAB  0   Ectopic  0   Multiple  0   Live Births               Home Medications    Prior to Admission medications   Medication Sig Start Date End Date Taking? Authorizing Provider  acetaminophen  (TYLENOL ) 325 MG tablet Take 650 mg by mouth every 6 (six) hours as needed.   Yes [provider]  fluticasone  (FLONASE ) 50 MCG/ACT nasal spray Place 2 sprays into both nostrils daily. 12/23/23  Yes Willo Mini, NP  letrozole (FEMARA) 2.5 MG tablet Take 2.5 mg by mouth daily. 12/21/23  Yes [provider]  levocetirizine (XYZAL ) 5 MG tablet TAKE 1 TABLET BY MOUTH EVERY DAY IN THE EVENING 03/22/24  Yes Breeback, Jade L, PA-C  meclizine (ANTIVERT) 12.5 MG tablet Take 1 tablet (12.5 mg total) by mouth 3 (three) times daily as needed for dizziness. 07/05/24  Yes Maranda Jamee Jacob, MD    Family History Family History  Problem Relation Age of Onset   Breast cancer Maternal Grandmother    Lung cancer Maternal Grandfather    Alzheimer's disease Paternal Grandfather     Social History Social History   Tobacco Use   Smoking status: Never   Smokeless tobacco: Never  Vaping Use   Vaping status: Never Used  Substance Use Topics   Alcohol use: Yes    Alcohol/week: 2.0 standard drinks of alcohol    Types: 2 Standard drinks or equivalent per week    Comment: Occasional   Drug use: No     Allergies  Bactrim  [sulfamethoxazole -trimethoprim ]   Review of Systems Review of Systems   Physical Exam Triage Vital Signs ED Triage Vitals  Encounter Vitals Group     BP 07/05/24 1243 132/87     Girls Systolic BP Percentile --      Girls Diastolic BP Percentile --      Boys Systolic BP Percentile --      Boys Diastolic BP Percentile --      Pulse Rate 07/05/24 1243 (!) 118     Resp 07/05/24 1243 18     Temp 07/05/24 1243 98.6 F (37 C)     Temp Source 07/05/24 1243 Oral     SpO2 07/05/24 1243 97 %     Weight 07/05/24 1242 180 lb (81.6 kg)     Height 07/05/24 1242 5' 2 (1.575 m)     Head Circumference --      Peak Flow --      Pain Score 07/05/24 1242 0     Pain Loc --      Pain Education --      Exclude from Growth Chart --    No data found.  Updated Vital Signs BP 132/87 (BP Location: Right Arm)   Pulse (!) 118   Temp 98.6 F (37 C) (Oral)   Resp 18   Ht 5' 2 (1.575 m)   Wt 81.6 kg   LMP 06/26/2024   SpO2 97%   BMI 32.92 kg/m   Visual Acuity Right Eye  Distance:   Left Eye Distance:   Bilateral Distance:    Right Eye Near:   Left Eye Near:    Bilateral Near:     Physical Exam Constitutional:      General: She is not in acute distress.    Appearance: She is well-developed.     Comments: Worried about health  HENT:     Head: Normocephalic and atraumatic.     Right Ear: Tympanic membrane normal.     Left Ear: Tympanic membrane normal.     Nose: Nose normal. No congestion.     Mouth/Throat:     Mouth: Mucous membranes are moist.     Pharynx: No posterior oropharyngeal erythema.  Eyes:     Conjunctiva/sclera: Conjunctivae normal.     Pupils: Pupils are equal, round, and reactive to light.     Comments: Positive nystagmus with leftward gaze  Neck:     Comments: Tenderness mild bilaterally in the upper body at the trapezius Cardiovascular:     Rate and Rhythm: Tachycardia present.     Heart sounds: Normal heart sounds.  Pulmonary:     Effort: Pulmonary effort is normal. No respiratory distress.     Breath sounds: Normal breath sounds.  Abdominal:     General: There is no distension.     Palpations: Abdomen is soft.  Musculoskeletal:        General: Normal range of motion.     Cervical back: Normal range of motion. Tenderness present.  Skin:    General: Skin is warm and dry.  Neurological:     General: No focal deficit present.     Mental Status: She is alert.      UC Treatments / Results  Labs (all labs ordered are listed, but only abnormal results are displayed) Labs Reviewed - No data to display  EKG-sinus tachycardia.  Rate 101.  Otherwise normal sinus rhythm.  No ST or T wave changes   Radiology No results found.  Procedures Procedures (including  critical care time)  Medications Ordered in UC Medications - No data to display  Initial Impression / Assessment and Plan / UC Course  I have reviewed the triage vital signs and the nursing notes.  Pertinent labs & imaging results that were available during  my care of the patient were reviewed by me and considered in my medical decision making (see chart for details).     I believe the chest pain is just stress and worry about her left arm pain.  The left arm pain appears to be from the muscle tenderness.  Other symptoms are typical for vertigo.  I believe this is causing her to be somewhat anxious. Final Clinical Impressions(s) / UC Diagnoses   Final diagnoses:  Vertigo  Left arm numbness  Musculoskeletal neck pain  Sensation of chest pressure     Discharge Instructions      Vertigo is an imbalance in your inner ear.  It can happen suddenly.  I am going to give you medicine to treat the dizziness and some exercises to do at home.  See your doctor if it does not go away by next week You had some tightness in your neck muscles.  Ibuprofen  or Aleve may help with this.      ED Prescriptions     Medication Sig Dispense Auth. Provider   meclizine (ANTIVERT) 12.5 MG tablet Take 1 tablet (12.5 mg total) by mouth 3 (three) times daily as needed for dizziness. 30 tablet Maranda Jamee Jacob, MD      PDMP not reviewed this encounter.   Maranda Jamee Jacob, MD 07/05/24 1314

## 2024-07-05 NOTE — ED Triage Notes (Signed)
 Patient states that she laid down last night and when she got up to go to the kitchen, she became extremely dizzy, slight HA.  Still feeling some dizziness, still HA, nausea.  Taken 800mg  of Ibuprofen .  No history of vertigo.  Left hand tingling.

## 2024-07-05 NOTE — Discharge Instructions (Addendum)
 Vertigo is an imbalance in your inner ear.  It can happen suddenly.  I am going to give you medicine to treat the dizziness and some exercises to do at home.  See your doctor if it does not go away by next week You had some tightness in your neck muscles.  Ibuprofen  or Aleve may help with this.

## 2024-08-09 ENCOUNTER — Ambulatory Visit: Admitting: Sports Medicine

## 2024-08-09 ENCOUNTER — Ambulatory Visit: Payer: Self-pay

## 2024-08-09 ENCOUNTER — Encounter: Payer: Self-pay | Admitting: Sports Medicine

## 2024-08-09 VITALS — BP 122/85 | HR 102 | Temp 99.0°F | Wt 183.0 lb

## 2024-08-09 DIAGNOSIS — J0100 Acute maxillary sinusitis, unspecified: Secondary | ICD-10-CM

## 2024-08-09 DIAGNOSIS — R059 Cough, unspecified: Secondary | ICD-10-CM

## 2024-08-09 DIAGNOSIS — R350 Frequency of micturition: Secondary | ICD-10-CM

## 2024-08-09 LAB — POC URINALSYSI DIPSTICK (AUTOMATED)
Bilirubin, UA: NEGATIVE
Glucose, UA: NEGATIVE — AB
Nitrite, UA: NEGATIVE
Protein, UA: POSITIVE — AB
Spec Grav, UA: 1.015 (ref 1.010–1.025)
Urobilinogen, UA: 0.2 U/dL
pH, UA: 6 (ref 5.0–8.0)

## 2024-08-09 LAB — POCT INFLUENZA A/B
Influenza A, POC: NEGATIVE
Influenza B, POC: NEGATIVE

## 2024-08-09 LAB — POC COVID19 BINAXNOW: SARS Coronavirus 2 Ag: NEGATIVE

## 2024-08-09 MED ORDER — CIPROFLOXACIN HCL 500 MG PO TABS: 500.0000 mg | ORAL_TABLET | Freq: Two times a day (BID) | ORAL | 0 refills | Status: AC

## 2024-08-09 NOTE — Telephone Encounter (Signed)
 FYI Only or Action Required?: FYI only for provider: appointment scheduled on today, aware of clinic location.  Patient was last seen in primary care on 04/27/2024 by Alvia Bring, DO.  Called Nurse Triage reporting Urinary Frequency.  Symptoms began yesterday.  Interventions attempted: Rest, hydration, or home remedies.  Symptoms are: gradually worsening.  Triage Disposition: See HCP Within 4 Hours (Or PCP Triage)  Patient/caregiver understands and will follow disposition?: Yes    Copied from CRM #[id number]. Topic: Clinical - Red Word Triage >> Aug 09, 2024 11:13 AM [place] MATSU wrote: Red Word that prompted transfer to Nurse Triage: lower back pain/ uti symptoms and sore throat. Reason for Disposition  Side (flank) or lower back pain present  Answer Assessment - Initial Assessment Questions Additional info: Scheduled acute at alternate regional clinic, address provided.    1. SYMPTOM: What's the main symptom you're concerned about? (e.g., frequency, incontinence)     Low back pain, increased urinary frequency  2. ONSET: When did the  frequency  start?     Yesterday  3. PAIN: Is there any pain? If Yes, ask: How bad is it? (Scale: 1-10; mild, moderate, severe)     No dysuria but mild lower back pain 4. CAUSE: What do you think is causing the symptoms?     Uti-feels like uti coming on 5. OTHER SYMPTOMS: Do you have any other symptoms? (e.g., blood in urine, fever, flank pain, pain with urination)     Sore throat started yesterday, mild sinus pressure 6. PREGNANCY: Is there any chance you are pregnant? When was your last menstrual period?  Protocols used: Urinary Symptoms-A-AH

## 2024-08-09 NOTE — Telephone Encounter (Signed)
 Patient was seen today at outside facility for symptoms.

## 2024-08-09 NOTE — Progress Notes (Signed)
 Careteam: Patient Care Team: Breeback, Jade L, PA-C as PCP - General (Family Medicine)  Allergies  Allergen Reactions   Bactrim  [Sulfamethoxazole -Trimethoprim ]     Nausea/vomiting    Chief Complaint  Patient presents with   Urinary Frequency    Pt has been having urinary frequency for a few days. She has also been having upper respiratory symptoms that started yesterday. Cough, facial pain and blood when she blows her nose.    Discussed the use of AI scribe software for clinical note transcription with the patient, who gave verbal consent to proceed.  History of Present Illness    43 yr old female with recurrent urinary tract infections who presents with symptoms of a UTI and sinus infection.  She reports symptoms that began approximately 24 hours ago, including increased urinary frequency and discomfort in the lower abdomen, though not specifically pain. She also notes lower back pain and has a history of recurrent UTIs,  recent one in October. She vomited yesterday, which she associates with the onset of UTIs. No blood in the urine, diarrhea, or loose stools.    In addition to the UTI, she experiences symptoms of a sinus infection that began yesterday, including nasal congestion, pain in the sinus area, a headache, and a small amount of blood in her nasal discharge. She has a slight cough without phlegm, a sore throat, but no difficulty swallowing. No dizziness, lightheadedness, ear pain, drainage, blurred or double vision, chest pain, or palpitations. She has not experienced chills, fatigue, or nausea today.  Review of Systems:  Review of Systems  Constitutional:  Positive for fever. Negative for chills and malaise/fatigue.  HENT:  Positive for sinus pain and sore throat. Negative for ear discharge and ear pain.   Eyes:  Negative for blurred vision and photophobia.  Respiratory:  Positive for cough. Negative for sputum production and shortness of breath.   Cardiovascular:   Negative for chest pain, palpitations and leg swelling.  Gastrointestinal:  Positive for abdominal pain (lower abdominal discomfort) and vomiting. Negative for nausea.  Genitourinary:  Positive for dysuria, frequency and urgency.  Musculoskeletal:  Positive for back pain.  Neurological:  Positive for headaches. Negative for dizziness.   Negative unless indicated in HPI.   Patient Active Problem List   Diagnosis Date Noted   Acute bacterial sinusitis 10/06/2023   Elevated blood pressure reading 10/06/2023   Proteinuria 04/27/2023   ACUTE BRONCHOSPASM 03/15/2008   Past Medical History:  Diagnosis Date   Asthma    Ovarian cyst    Past Surgical History:  Procedure Laterality Date   CHOLECYSTECTOMY     KNEE SURGERY     MOLE REMOVAL     Social History   Tobacco Use   Smoking status: Never   Smokeless tobacco: Never  Vaping Use   Vaping status: Never Used  Substance Use Topics   Alcohol use: Yes    Alcohol/week: 2.0 standard drinks of alcohol    Types: 2 Standard drinks or equivalent per week    Comment: Occasional   Drug use: No   Family History  Problem Relation Age of Onset   Breast cancer Maternal Grandmother    Lung cancer Maternal Grandfather    Alzheimer's disease Paternal Grandfather    Allergies  Allergen Reactions   Bactrim  [Sulfamethoxazole -Trimethoprim ]     Nausea/vomiting    Medications: Patient's Medications  New Prescriptions   No medications on file  Previous Medications   ACETAMINOPHEN  (TYLENOL ) 325 MG TABLET    Take  650 mg by mouth every 6 (six) hours as needed.   FLUTICASONE  (FLONASE ) 50 MCG/ACT NASAL SPRAY    Place 2 sprays into both nostrils daily.   LETROZOLE (FEMARA) 2.5 MG TABLET    Take 2.5 mg by mouth daily.   LEVOCETIRIZINE (XYZAL ) 5 MG TABLET    TAKE 1 TABLET BY MOUTH EVERY DAY IN THE EVENING   MECLIZINE  (ANTIVERT ) 12.5 MG TABLET    Take 1 tablet (12.5 mg total) by mouth 3 (three) times daily as needed for dizziness.  Modified  Medications   No medications on file  Discontinued Medications   No medications on file    Physical Exam: Vitals:   08/09/24 1330  BP: 122/85  Pulse: (!) 112  Temp: 99 F (37.2 C)  TempSrc: Oral  SpO2: 99%  Weight: 183 lb (83 kg)   Body mass index is 33.47 kg/m. BP Readings from Last 3 Encounters:  08/09/24 122/85  07/05/24 132/87  06/11/24 127/87   Wt Readings from Last 3 Encounters:  08/09/24 183 lb (83 kg)  07/05/24 180 lb (81.6 kg)  04/27/24 188 lb 6.4 oz (85.5 kg)    Physical Exam Constitutional:      Appearance: Normal appearance.  HENT:     Head: Normocephalic and atraumatic.     Right Ear: Tympanic membrane normal.     Left Ear: Tympanic membrane normal.     Mouth/Throat:     Pharynx: No oropharyngeal exudate or posterior oropharyngeal erythema.     Comments: Mild rt maxillary sinus pain Cardiovascular:     Rate and Rhythm: Normal rate and regular rhythm.  Pulmonary:     Effort: Pulmonary effort is normal. No respiratory distress.     Breath sounds: Normal breath sounds. No wheezing.  Abdominal:     General: Bowel sounds are normal. There is no distension.     Tenderness: There is no abdominal tenderness. There is no guarding or rebound.     Comments:    Musculoskeletal:        General: No swelling or tenderness.  Neurological:     Mental Status: She is alert. Mental status is at baseline.     Motor: No weakness.     Labs reviewed: Basic Metabolic Panel: Recent Labs    09/14/23 1200  NA 138  K 4.3  CL 102  CO2 23  GLUCOSE 84  BUN 16  CREATININE 0.87  CALCIUM 8.8   Liver Function Tests: Recent Labs    09/14/23 1200  AST 20  ALT 21  ALKPHOS 102  BILITOT 0.5  PROT 6.8  ALBUMIN 4.4   No results for input(s): LIPASE, AMYLASE in the last 8760 hours. No results for input(s): AMMONIA in the last 8760 hours. CBC: Recent Labs    09/14/23 1200  WBC 7.1  NEUTROABS 5.0  HGB 14.0  HCT 42.8  MCV 89  PLT 225   Lipid  Panel: No results for input(s): CHOL, HDL, LDLCALC, TRIG, CHOLHDL, LDLDIRECT in the last 8760 hours. TSH: No results for input(s): TSH in the last 8760 hours. A1C: No results found for: HGBA1C  Assessment & Plan Urinary frequency Pt c/o urinary frequency and dysuria with lower back pain  Urine dipstick positive Will start cipro  Orders:   POCT Urinalysis Dipstick (Automated)   ciprofloxacin  (CIPRO ) 500 MG tablet; Take 1 tablet (500 mg total) by mouth 2 (two) times daily for 5 days.  Cough, unspecified type Flu , covid neg Instructed to take robitussin q6 prn Orders:  POC COVID-19 BinaxNow   POCT Influenza A/B  Acute non-recurrent maxillary sinusitis Symptoms since yesterday  Mild sinus pain Instructed to use claritin, flonase ,      No follow-ups on file.:   Stephanie Hogan

## 2024-08-10 ENCOUNTER — Telehealth: Payer: Self-pay

## 2024-08-10 ENCOUNTER — Encounter: Payer: Self-pay | Admitting: Sports Medicine

## 2024-08-10 NOTE — Telephone Encounter (Signed)
 Reason for CRM: patient would like a return to work note with a return to work date of 08/11/24 from yesterday appointment.  Please contact when note is available.  [phone]

## 2024-08-10 NOTE — Telephone Encounter (Signed)
 Okay for work note? Pt had visit yesterday

## 2024-08-13 LAB — URINE CULTURE
MICRO NUMBER:: 17307580
SPECIMEN QUALITY:: ADEQUATE

## 2024-09-14 ENCOUNTER — Encounter: Payer: Self-pay | Admitting: Emergency Medicine

## 2024-09-14 ENCOUNTER — Ambulatory Visit
Admission: EM | Admit: 2024-09-14 | Discharge: 2024-09-14 | Disposition: A | Discharge status: Home / Self Care | Attending: Family Medicine | Admitting: Family Medicine

## 2024-09-14 ENCOUNTER — Telehealth: Payer: Self-pay

## 2024-09-14 DIAGNOSIS — Z20828 Contact with and (suspected) exposure to other viral communicable diseases: Secondary | ICD-10-CM | POA: Diagnosis not present

## 2024-09-14 DIAGNOSIS — J029 Acute pharyngitis, unspecified: Secondary | ICD-10-CM | POA: Diagnosis not present

## 2024-09-14 DIAGNOSIS — J020 Streptococcal pharyngitis: Secondary | ICD-10-CM

## 2024-09-14 LAB — POCT RAPID STREP A (OFFICE): Rapid Strep A Screen: POSITIVE — AB

## 2024-09-14 MED ORDER — AMOXICILLIN-POT CLAVULANATE 875-125 MG PO TABS: 1.0000 | ORAL_TABLET | Freq: Two times a day (BID) | ORAL | 0 refills | Status: AC

## 2024-09-14 NOTE — Discharge Instructions (Addendum)
 Advised patient strep test was positive this morning.  Advised to take medication as directed with food to completion.  Encouraged to increase daily water intake to 64 ounces per day while taking this medication.  Advised if symptoms worsen and/or unresolved please follow-up with your PCP or here for further evaluation.

## 2024-09-14 NOTE — ED Triage Notes (Signed)
 Patient c/o sore throat, sneezing, HA, cough that started this morning.  Patient has taken Tylenol  Cold & Flu.

## 2024-09-14 NOTE — Telephone Encounter (Signed)
 Patient seen in UC this morning after calling to the office.   Copied from CRM [id number]. Topic: Clinical - Red Word Triage >> Sep 14, 2024  8:09 AM [place] MATSU wrote: Red Word that prompted transfer to Nurse Triage: patient stated her throat has been sore/painful level 10. Coughing/headaches

## 2024-09-14 NOTE — ED Provider Notes (Signed)
 " Stephanie Hogan [facility]    CSN: [id number] Arrival date & time: 09/14/24  9180      History   Chief Complaint Chief Complaint  Patient presents with   Sore Throat    HPI Stephanie Hogan is a 44 y.o. female.   HPI 44 year old female presents with intense sore throat, sneezing, headache and cough that started this morning.  Patient has been taking Tylenol  Cold and flu.  PMH significant for asthma and ovarian cyst.  Patient request influenza and strep test this morning.  Past Medical History:  Diagnosis Date   Asthma    Ovarian cyst     Patient Active Problem List   Diagnosis Date Noted   Acute bacterial sinusitis 10/06/2023   Elevated blood pressure reading 10/06/2023   Proteinuria 04/27/2023   ACUTE BRONCHOSPASM 03/15/2008    Past Surgical History:  Procedure Laterality Date   CHOLECYSTECTOMY     KNEE SURGERY     MOLE REMOVAL      OB History     Gravida  1   Para  0   Term  0   Preterm  0   AB  0   Living  0      SAB  0   IAB  0   Ectopic  0   Multiple  0   Live Births               Home Medications    Prior to Admission medications  Medication Sig Start Date End Date Taking? Authorizing Provider  acetaminophen  (TYLENOL ) 325 MG tablet Take 650 mg by mouth every 6 (six) hours as needed.   Yes [provider]  amoxicillin -clavulanate (AUGMENTIN ) 875-125 MG tablet Take 1 tablet by mouth 2 (two) times daily for 7 days. 09/14/24 09/21/24 Yes Teddy Sharper, FNP  meclizine  (ANTIVERT ) 12.5 MG tablet Take 1 tablet (12.5 mg total) by mouth 3 (three) times daily as needed for dizziness. 07/05/24   Maranda Jamee Jacob, MD    Family History Family History  Problem Relation Age of Onset   Breast cancer Maternal Grandmother    Lung cancer Maternal Grandfather    Alzheimer's disease Paternal Grandfather     Social History Social History[1]   Allergies   Bactrim  [sulfamethoxazole -trimethoprim ]   Review of Systems Review of Systems   HENT:  Positive for sore throat.   All other systems reviewed and are negative.    Physical Exam Triage Vital Signs ED Triage Vitals  Encounter Vitals Group     BP 09/14/24 0842 127/89     Girls Systolic BP Percentile --      Girls Diastolic BP Percentile --      Boys Systolic BP Percentile --      Boys Diastolic BP Percentile --      Pulse Rate 09/14/24 0842 99     Resp 09/14/24 0842 18     Temp 09/14/24 0842 98.4 F (36.9 C)     Temp Source 09/14/24 0842 Oral     SpO2 09/14/24 0842 98 %     Weight 09/14/24 0841 180 lb (81.6 kg)     Height 09/14/24 0841 5' 2 (1.575 m)     Head Circumference --      Peak Flow --      Pain Score 09/14/24 0841 10     Pain Loc --      Pain Education --      Exclude from Growth Chart --    No data  found.  Updated Vital Signs BP 127/89 (BP Location: Right Arm)   Pulse 99   Temp 98.4 F (36.9 C) (Oral)   Resp 18   Ht 5' 2 (1.575 m)   Wt 180 lb (81.6 kg)   LMP 09/05/2024   SpO2 98%   BMI 32.92 kg/m   Visual Acuity Right Eye Distance:   Left Eye Distance:   Bilateral Distance:    Right Eye Near:   Left Eye Near:    Bilateral Near:     Physical Exam Vitals and nursing note reviewed.  Constitutional:      Appearance: Normal appearance. She is normal weight.  HENT:     Head: Normocephalic and atraumatic.     Right Ear: Tympanic membrane, ear canal and external ear normal.     Left Ear: Tympanic membrane, ear canal and external ear normal.     Mouth/Throat:     Mouth: Mucous membranes are moist.     Pharynx: Oropharynx is clear. Uvula midline. Posterior oropharyngeal erythema and uvula swelling present.     Tonsils: 1+ on the right. 1+ on the left.  Eyes:     Extraocular Movements: Extraocular movements intact.     Conjunctiva/sclera: Conjunctivae normal.     Pupils: Pupils are equal, round, and reactive to light.  Cardiovascular:     Rate and Rhythm: Normal rate and regular rhythm.     Heart sounds: Normal heart sounds.   Pulmonary:     Effort: Pulmonary effort is normal.     Breath sounds: Normal breath sounds. No wheezing, rhonchi or rales.  Musculoskeletal:        General: Normal range of motion.  Skin:    General: Skin is warm and dry.  Neurological:     General: No focal deficit present.     Mental Status: She is alert and oriented to person, place, and time.  Psychiatric:        Mood and Affect: Mood normal.        Behavior: Behavior normal.      UC Treatments / Results  Labs (all labs ordered are listed, but only abnormal results are displayed) Labs Reviewed  POCT RAPID STREP A (OFFICE) - Abnormal; Notable for the following components:      Result Value   Rapid Strep A Screen Positive (*)    All other components within normal limits  POCT INFLUENZA A/B    EKG   Radiology No results found.  Procedures Procedures (including critical care time)  Medications Ordered in UC Medications - No data to display  Initial Impression / Assessment and Plan / UC Course  I have reviewed the triage vital signs and the nursing notes.  Pertinent labs & imaging results that were available during my care of the patient were reviewed by me and considered in my medical decision making (see chart for details).     MDM: 1.  Strep pharyngitis-Rx'd Augmentin  875/125 mg tablet: Take 1 tablet twice daily x 7 days; 2.  Exposure to flu-influenza negative today patient advised; 3.  Sore throat-strep positive same as 1. Advised patient strep test was positive this morning.  Advised to take medication as directed with food to completion.  Encouraged to increase daily water intake to 64 ounces per day while taking this medication.  Advised if symptoms worsen and/or unresolved please follow-up with your PCP or here for further evaluation.  Patient discharged home, hemodynamically stable.  Work note provided to patient per her request prior  to discharge. Final Clinical Impressions(s) / UC Diagnoses   Final  diagnoses:  Sore throat  Exposure to the flu  Strep pharyngitis     Discharge Instructions      Advised patient strep test was positive this morning.  Advised to take medication as directed with food to completion.  Encouraged to increase daily water intake to 64 ounces per day while taking this medication.  Advised if symptoms worsen and/or unresolved please follow-up with your PCP or here for further evaluation.     ED Prescriptions     Medication Sig Dispense Auth. Provider   amoxicillin -clavulanate (AUGMENTIN ) 875-125 MG tablet Take 1 tablet by mouth 2 (two) times daily for 7 days. 14 tablet Vennela Jutte, FNP      PDMP not reviewed this encounter.    [1]  Social History Tobacco Use   Smoking status: Never   Smokeless tobacco: Never  Vaping Use   Vaping status: Never Used  Substance Use Topics   Alcohol use: Yes    Alcohol/week: 2.0 standard drinks of alcohol    Types: 2 Standard drinks or equivalent per week    Comment: Occasional   Drug use: No     Teddy Sharper, FNP 09/14/24 0935  "

## 2024-09-29 ENCOUNTER — Encounter: Payer: Self-pay | Admitting: Physician Assistant

## 2024-09-29 DIAGNOSIS — Z0184 Encounter for antibody response examination: Secondary | ICD-10-CM

## 2024-09-29 NOTE — Telephone Encounter (Signed)
[place] for titer?
